# Patient Record
Sex: Female | Born: 2000
Health system: Southern US, Community
[De-identification: ages and names within clinical notes are randomized; demographics above are authoritative.]

## PROBLEM LIST (undated history)

## (undated) DIAGNOSIS — G43909 Migraine, unspecified, not intractable, without status migrainosus: Secondary | ICD-10-CM

## (undated) DIAGNOSIS — K219 Gastro-esophageal reflux disease without esophagitis: Secondary | ICD-10-CM

## (undated) DIAGNOSIS — J45909 Unspecified asthma, uncomplicated: Secondary | ICD-10-CM

## (undated) DIAGNOSIS — R011 Cardiac murmur, unspecified: Secondary | ICD-10-CM

## (undated) HISTORY — DX: Migraine, unspecified, not intractable, without status migrainosus: G43.909

## (undated) HISTORY — DX: Gastro-esophageal reflux disease without esophagitis: K21.9

## (undated) HISTORY — PX: TYMPANOSTOMY TUBE PLACEMENT: SHX32

---
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
## 2020-01-04 ENCOUNTER — Ambulatory Visit: Payer: Self-pay | Attending: Internal Medicine

## 2020-01-04 DIAGNOSIS — Z23 Encounter for immunization: Secondary | ICD-10-CM

## 2020-01-04 NOTE — Progress Notes (Signed)
   Covid-19 Vaccination Clinic  Name:  Allison Hayes    MRN: [id number] DOB: 07/17/2001  01/04/2020  [name] was observed post Covid-19 immunization for 15 minutes without incident. She was provided with Vaccine Information Sheet and instruction to access the V-Safe system.   Ms. Test was instructed to call 911 with any severe reactions post vaccine: Marland Kitchen Difficulty breathing  . Swelling of face and throat  . A fast heartbeat  . A bad rash all over body  . Dizziness and weakness   Immunizations Administered    Name Date Dose VIS Date Route   Moderna COVID-19 Vaccine 01/04/2020 11:20 AM 0.5 mL 09/26/2019 Intramuscular   Manufacturer: Moderna   Lot: [id number]   NDC: 77939-688-64

## 2020-02-06 ENCOUNTER — Ambulatory Visit: Payer: Self-pay | Attending: Internal Medicine

## 2020-02-06 DIAGNOSIS — Z23 Encounter for immunization: Secondary | ICD-10-CM

## 2020-02-06 NOTE — Progress Notes (Signed)
   Covid-19 Vaccination Clinic  Name:  Allison Hayes    MRN: [id number] DOB: [birth year]  02/06/2020  Allison Hayes was observed post Covid-19 immunization for 15 minutes without incident. She was provided with Vaccine Information Sheet and instruction to access the V-Safe system.   Allison Hayes was instructed to call 911 with any severe reactions post vaccine: Marland Kitchen Difficulty breathing  . Swelling of face and throat  . A fast heartbeat  . A bad rash all over body  . Dizziness and weakness   Immunizations Administered    Name Date Dose VIS Date Route   Moderna COVID-19 Vaccine 02/06/2020 11:30 AM 0.5 mL 09/26/2019 Intramuscular   Manufacturer: Moderna   Lot: [id number]   NDC: 30940-768-08

## 2020-03-27 ENCOUNTER — Telehealth: Payer: Self-pay | Admitting: Orthopedic Surgery

## 2020-03-27 NOTE — Telephone Encounter (Signed)
Marrie's mother, Gentry Fitz called yesterday stating that Christon's hip was bothering her and wanted to know if Shelita could come in before the end of the week to see Dr. Romeo Apple.  I told her that as it stands, I did not have any new patient appointments available.  She asked if I would call her back and let her know if we had a cancellation and I told her that I would do that.

## 2020-05-17 ENCOUNTER — Telehealth (INDEPENDENT_AMBULATORY_CARE_PROVIDER_SITE_OTHER): Payer: Self-pay | Admitting: Neurology

## 2020-05-17 DIAGNOSIS — R519 Headache, unspecified: Secondary | ICD-10-CM

## 2020-05-17 MED ORDER — AMITRIPTYLINE HCL 25 MG PO TABS: 25.0000 mg | ORAL_TABLET | Freq: Every day | ORAL | 0 refills | Status: DC

## 2020-05-17 NOTE — Telephone Encounter (Signed)
Need approval for refill

## 2020-05-17 NOTE — Telephone Encounter (Signed)
°  Who's calling (name and relationship to patient) : Gentry Fitz ( mom)  Best contact number:[phone]  Provider they see: Dr. Devonne Doughty  Reason for call: Mom called patient needs refill called in for her amitriptyline they are going out of town tomorrow for two weeks and she needs this ASAP. Patient has not been seen in office since 10-03-2019 please advise if I need to schedule an appointment for this patient.     PRESCRIPTION REFILL ONLY  Name of prescription: Amitriptyline   [facility]acy: [facility] [address] [place]

## 2020-05-17 NOTE — Telephone Encounter (Signed)
Patient is in need of an appointment to continue receiving refills. I have sent in a 30-day refill to her pharmacy as well. Please call to schedule

## 2020-06-16 ENCOUNTER — Other Ambulatory Visit (INDEPENDENT_AMBULATORY_CARE_PROVIDER_SITE_OTHER): Payer: Self-pay | Admitting: Neurology

## 2020-06-16 DIAGNOSIS — R519 Headache, unspecified: Secondary | ICD-10-CM

## 2020-06-17 ENCOUNTER — Telehealth (INDEPENDENT_AMBULATORY_CARE_PROVIDER_SITE_OTHER): Payer: Self-pay | Admitting: Neurology

## 2020-06-17 DIAGNOSIS — R519 Headache, unspecified: Secondary | ICD-10-CM

## 2020-06-17 NOTE — Telephone Encounter (Signed)
°  Who's calling (name and relationship to patient) : Gentry Fitz (mom)  Best contact number: [phone]  Provider they see: Dr. Devonne Doughty  Reason for call: Needs refill sent to pharmacy.    PRESCRIPTION REFILL ONLY  Name of prescription: amitriptyline (ELAVIL) 25 MG tablet  Pharmacy:  [id number] - [place],  - [address] AT [address]

## 2020-06-18 MED ORDER — AMITRIPTYLINE HCL 25 MG PO TABS: 25.0000 mg | ORAL_TABLET | Freq: Every day | ORAL | 0 refills | Status: DC

## 2020-06-19 ENCOUNTER — Other Ambulatory Visit (INDEPENDENT_AMBULATORY_CARE_PROVIDER_SITE_OTHER): Payer: Self-pay | Admitting: Neurology

## 2020-06-19 DIAGNOSIS — R519 Headache, unspecified: Secondary | ICD-10-CM

## 2020-07-19 ENCOUNTER — Ambulatory Visit (INDEPENDENT_AMBULATORY_CARE_PROVIDER_SITE_OTHER): Payer: BC Managed Care – PPO | Admitting: Neurology

## 2020-07-20 ENCOUNTER — Other Ambulatory Visit (INDEPENDENT_AMBULATORY_CARE_PROVIDER_SITE_OTHER): Payer: Self-pay | Admitting: Pediatrics

## 2020-07-20 DIAGNOSIS — R519 Headache, unspecified: Secondary | ICD-10-CM

## 2020-07-22 ENCOUNTER — Telehealth (INDEPENDENT_AMBULATORY_CARE_PROVIDER_SITE_OTHER): Payer: Self-pay | Admitting: Neurology

## 2020-07-22 NOTE — Telephone Encounter (Signed)
  Who's calling (name and relationship to patient) : Gentry Fitz (mom)  Best contact number: [phone]  Provider they see: Dr. Devonne Doughty  Reason for call: Needs refill sent to pharmacy - completely out of medication.    PRESCRIPTION REFILL ONLY  Name of prescription: amitriptyline (ELAVIL) 25 MG tablet  Pharmacy: [id number] - [place], [place] - [address]

## 2020-07-22 NOTE — Telephone Encounter (Signed)
Sent by [employer]

## 2020-08-02 ENCOUNTER — Encounter (INDEPENDENT_AMBULATORY_CARE_PROVIDER_SITE_OTHER): Payer: Self-pay | Admitting: Neurology

## 2020-08-02 ENCOUNTER — Ambulatory Visit (INDEPENDENT_AMBULATORY_CARE_PROVIDER_SITE_OTHER): Payer: BC Managed Care – PPO | Admitting: Neurology

## 2020-08-02 ENCOUNTER — Other Ambulatory Visit: Payer: Self-pay

## 2020-08-02 VITALS — BP 118/76 | HR 74 | Ht 62.01 in | Wt 161.4 lb

## 2020-08-02 DIAGNOSIS — R519 Headache, unspecified: Secondary | ICD-10-CM | POA: Diagnosis not present

## 2020-08-02 DIAGNOSIS — G44209 Tension-type headache, unspecified, not intractable: Secondary | ICD-10-CM | POA: Diagnosis not present

## 2020-08-02 DIAGNOSIS — F411 Generalized anxiety disorder: Secondary | ICD-10-CM

## 2020-08-02 MED ORDER — AMITRIPTYLINE HCL 25 MG PO TABS: ORAL_TABLET | ORAL | 6 refills | Status: DC

## 2020-08-02 NOTE — Patient Instructions (Addendum)
Continue the same dose of amitriptyline for now Continue with appropriate hydration and sleep and limited screen time May take occasional Tylenol or ibuprofen for moderate to severe headache If if you develop more frequent headaches, call the office and let me know  Have regular exercise on a daily basis Return in 7 months for follow-up visit

## 2020-08-02 NOTE — Progress Notes (Signed)
Patient: Allison Hayes MRN: [id number] Sex: female DOB: 2001/09/21  Provider: Keturah Shavers, MD Location of Care: [facility]  Note type: Routine return visit  Referral Source: Nelda Marseille, MD History from: patient, [facility] chart and mom Chief Complaint: Headache  History of Present Illness: Allison Hayes is a 19 y.o. female is here for follow-up management of headache. She has been having episodes of migraine and tension type headaches with some anxiety issues for the past few years for which she has been on low-dose amitriptyline with good headache control.  She was last seen in December 2020 and since then she has been having on average 1 headache each week which for some of them she may take OTC medications but not for all of them. None of the headaches would be accompanied by nausea or vomiting or any other symptoms and usually it may last for a couple of hours or so. She usually sleeps well without any difficulty and with no awakening headaches. She denies having any specific stress or anxiety issues except for stress at work but she is doing now.  She is also in college. She is not on any other medication and has not had any other medical issues since her last visit.  She and her mother are happy with her progress and do not have any other complaints or concerns at this time.  Review of Systems: Review of system as per HPI, otherwise negative.  Past Medical History:  Diagnosis Date  . Asthma   . Heart murmur    Hospitalizations: No., Head Injury: No., Nervous System Infections: No., Immunizations up to date: Yes.     Surgical History Past Surgical History:  Procedure Laterality Date  . TYMPANOSTOMY TUBE PLACEMENT      Family History family history includes Allergic rhinitis in her brother, father, mother, and sister; Anxiety disorder in her mother.  Social History Social History   Socioeconomic History  . Marital status: Single    Spouse name:  Not on file  . Number of children: Not on file  . Years of education: Not on file  . Highest education level: Not on file  Occupational History  . Not on file  Tobacco Use  . Smoking status: Never Smoker  . Smokeless tobacco: Never Used  Substance and Sexual Activity  . Alcohol use: No  . Drug use: No  . Sexual activity: Not on file  Other Topics Concern  . Not on file  Social History Narrative   Lives at home with mom, dad and brother. She is planning on attending [employer]. She enjoys Public house manager, music and watching TV   Social Determinants of Health   Financial Resource Strain:   . Difficulty of Paying Living Expenses: Not on file  Food Insecurity:   . Worried About Programme researcher, broadcasting/film/video in the Last Year: Not on file  . Ran Out of Food in the Last Year: Not on file  Transportation Needs:   . Lack of Transportation (Medical): Not on file  . Lack of Transportation (Non-Medical): Not on file  Physical Activity:   . Days of Exercise per Week: Not on file  . Minutes of Exercise per Session: Not on file  Stress:   . Feeling of Stress : Not on file  Social Connections:   . Frequency of Communication with Friends and Family: Not on file  . Frequency of Social Gatherings with Friends and Family: Not on file  . Attends Religious Services: Not  on file  . Active Member of Clubs or Organizations: Not on file  . Attends Banker Meetings: Not on file  . Marital Status: Not on file     No Known Allergies  Physical Exam BP 118/76   Pulse 74   Ht 5' 2.01" (1.575 m)   Wt 161 lb 6 oz (73.2 kg)   BMI 29.51 kg/m  Gen: Awake, alert, not in distress Skin: No rash, No neurocutaneous stigmata. HEENT: Normocephalic, no dysmorphic features, no conjunctival injection, nares patent, mucous membranes moist, oropharynx clear. Neck: Supple, no meningismus. No focal tenderness. Resp: Clear to auscultation bilaterally CV: Regular rate, normal S1/S2, no murmurs, no rubs Abd:  BS present, abdomen soft, non-tender, non-distended. No hepatosplenomegaly or mass Ext: Warm and well-perfused. No deformities, no muscle wasting, ROM full.  Neurological Examination: MS: Awake, alert, interactive. Normal eye contact, answered the questions appropriately, speech was fluent,  Normal comprehension.  Attention and concentration were normal. Cranial Nerves: Pupils were equal and reactive to light ( 5-31mm);  normal fundoscopic exam with sharp discs, visual field full with confrontation test; EOM normal, no nystagmus; no ptsosis, no double vision, intact facial sensation, face symmetric with full strength of facial muscles, hearing intact to finger rub bilaterally, palate elevation is symmetric, tongue protrusion is symmetric with full movement to both sides.  Sternocleidomastoid and trapezius are with normal strength. Tone-Normal Strength-Normal strength in all muscle groups DTRs-  Biceps Triceps Brachioradialis Patellar Ankle  R 2+ 2+ 2+ 2+ 2+  L 2+ 2+ 2+ 2+ 2+   Plantar responses flexor bilaterally, no clonus noted Sensation: Intact to light touch, temperature, vibration, Romberg negative. Coordination: No dysmetria on FTN test. No difficulty with balance. Gait: Normal walk and run. Tandem gait was normal. Was able to perform toe walking and heel walking without difficulty.   Assessment and Plan 1. Frequent headaches   2. Tension headache   3. Anxiety state     This is a 19 year old female with diagnosis of migraine and tension type headaches with fairly good control on low-dose amitriptyline, tolerating medication well with no side effects. Recommend to continue the same dose of amitriptyline at 25 mg every night. She may benefit from taking dietary supplements. She also may benefit from regular exercise on a daily basis. She will continue with more hydration and adequate sleep and limited screen time She may take occasional Tylenol or ibuprofen for moderate to severe  headache She will call my office if she develops more frequent headaches Otherwise I would like to see her in 7 months for follow-up visit to adjust the dose of medication if needed.  She and her mother understood and agreed with the plan.  Meds ordered this encounter  Medications  . amitriptyline (ELAVIL) 25 MG tablet    Sig: TAKE 1 TABLET(25 MG) BY MOUTH AT BEDTIME.    Dispense:  30 tablet    Refill:  6

## 2020-08-18 ENCOUNTER — Encounter: Payer: Self-pay | Admitting: Emergency Medicine

## 2020-08-18 ENCOUNTER — Other Ambulatory Visit: Payer: Self-pay

## 2020-08-18 ENCOUNTER — Ambulatory Visit
Admission: EM | Admit: 2020-08-18 | Discharge: 2020-08-18 | Disposition: A | Discharge status: Home / Self Care | Payer: BC Managed Care – PPO | Attending: Emergency Medicine | Admitting: Emergency Medicine

## 2020-08-18 DIAGNOSIS — J069 Acute upper respiratory infection, unspecified: Secondary | ICD-10-CM

## 2020-08-18 DIAGNOSIS — Z1152 Encounter for screening for COVID-19: Secondary | ICD-10-CM

## 2020-08-18 DIAGNOSIS — J019 Acute sinusitis, unspecified: Secondary | ICD-10-CM

## 2020-08-18 DIAGNOSIS — B9789 Other viral agents as the cause of diseases classified elsewhere: Secondary | ICD-10-CM | POA: Diagnosis not present

## 2020-08-18 MED ORDER — CETIRIZINE HCL 10 MG PO TABS: 10.0000 mg | ORAL_TABLET | Freq: Every day | ORAL | 0 refills | Status: AC

## 2020-08-18 MED ORDER — BENZONATATE 100 MG PO CAPS: 100.0000 mg | ORAL_CAPSULE | Freq: Three times a day (TID) | ORAL | 0 refills | Status: DC

## 2020-08-18 MED ORDER — FLUTICASONE PROPIONATE 50 MCG/ACT NA SUSP: 1.0000 | Freq: Every day | NASAL | 0 refills | Status: DC

## 2020-08-18 MED ORDER — DEXAMETHASONE 4 MG PO TABS: 4.0000 mg | ORAL_TABLET | Freq: Every day | ORAL | 0 refills | Status: AC

## 2020-08-18 NOTE — ED Triage Notes (Signed)
Headache, sinus pressure, congestion and sounds are muffled in both ears started since yesterday morning.

## 2020-08-18 NOTE — ED Provider Notes (Signed)
[facility]   [id number] 08/18/20 Arrival Time: 1534   Chief Complaint  Patient presents with  . Headache     SUBJECTIVE: History from: patient.  Allison Hayes is a 19 y.o. female who presented to the urgent care with a complaint of headache, sinus pressure, nasal congestion and muffled sound in both ears that started yesterday.  Denies sick exposure to COVID, flu or strep.  Denies recent travel.  Has tried OTC medication without relief.  Denies aggravating factor.  Denies previous symptoms in the past.   Denies fever, chills, fatigue,  rhinorrhea, sore throat, SOB, wheezing, chest pain, nausea, changes in bowel or bladder habits.     ROS: As per HPI.  All other pertinent ROS negative.      Past Medical History:  Diagnosis Date  . Asthma   . Heart murmur    Past Surgical History:  Procedure Laterality Date  . TYMPANOSTOMY TUBE PLACEMENT     No Known Allergies No current facility-administered medications on file prior to encounter.   Current Outpatient Medications on File Prior to Encounter  Medication Sig Dispense Refill  . albuterol (PROVENTIL HFA;VENTOLIN HFA) 108 (90 BASE) MCG/ACT inhaler Inhale 2 puffs into the lungs every 6 (six) hours as needed for wheezing or shortness of breath.    Marland Kitchen albuterol (PROVENTIL) (2.5 MG/3ML) 0.083% nebulizer solution Take 2.5 mg by nebulization every 6 (six) hours as needed for wheezing or shortness of breath.    Marland Kitchen albuterol (PROVENTIL) (2.5 MG/3ML) 0.083% nebulizer solution Take 3 mLs (2.5 mg total) by nebulization every 4 (four) hours. 75 mL 1  . amitriptyline (ELAVIL) 25 MG tablet TAKE 1 TABLET(25 MG) BY MOUTH AT BEDTIME. 30 tablet 6  . budesonide-formoterol (SYMBICORT) 80-4.5 MCG/ACT inhaler Inhale 2 puffs into the lungs 2 (two) times daily. 1 Inhaler 5  . Magnesium Oxide 500 MG TABS Take 1 tablet (500 mg total) by mouth daily. (Patient not taking: Reported on 04/27/2019)  0  . MICROGESTIN FE 1/20 1-20 MG-MCG tablet TK 1 T PO  D  2  . mometasone-formoterol (DULERA) 200-5 MCG/ACT AERO Inhale 2 puffs into the lungs 2 (two) times daily. (Patient not taking: Reported on 09/12/2018) 1 Inhaler 5  . montelukast (SINGULAIR) 10 MG tablet Take 10 mg by mouth at bedtime.    . norethindrone-ethinyl estradiol (AUROVELA 1/20) 1-20 MG-MCG tablet Take 1 tablet by mouth daily. (Patient not taking: Reported on 08/02/2020)    . riboflavin (VITAMIN B-2) 100 MG TABS tablet Take 1 tablet (100 mg total) by mouth daily. (Patient not taking: Reported on 04/27/2019)  0  . triamcinolone (NASACORT ALLERGY 24HR) 55 MCG/ACT AERO nasal inhaler Place 2 sprays into the nose daily.     Social History   Socioeconomic History  . Marital status: Single    Spouse name: Not on file  . Number of children: Not on file  . Years of education: Not on file  . Highest education level: Not on file  Occupational History  . Not on file  Tobacco Use  . Smoking status: Never Smoker  . Smokeless tobacco: Never Used  Substance and Sexual Activity  . Alcohol use: No  . Drug use: No  . Sexual activity: Not on file  Other Topics Concern  . Not on file  Social History Narrative   Lives at home with mom, dad and brother. She is planning on attending [employer]. She enjoys Public house manager, music and watching TV   Social Determinants of Health  Financial Resource Strain:   . Difficulty of Paying Living Expenses: Not on file  Food Insecurity:   . Worried About Programme researcher, broadcasting/film/video in the Last Year: Not on file  . Ran Out of Food in the Last Year: Not on file  Transportation Needs:   . Lack of Transportation (Medical): Not on file  . Lack of Transportation (Non-Medical): Not on file  Physical Activity:   . Days of Exercise per Week: Not on file  . Minutes of Exercise per Session: Not on file  Stress:   . Feeling of Stress : Not on file  Social Connections:   . Frequency of Communication with Friends and Family: Not on file  . Frequency of Social Gatherings  with Friends and Family: Not on file  . Attends Religious Services: Not on file  . Active Member of Clubs or Organizations: Not on file  . Attends Banker Meetings: Not on file  . Marital Status: Not on file  Intimate Partner Violence:   . Fear of Current or Ex-Partner: Not on file  . Emotionally Abused: Not on file  . Physically Abused: Not on file  . Sexually Abused: Not on file   Family History  Problem Relation Age of Onset  . Allergic rhinitis Mother   . Anxiety disorder Mother   . Allergic rhinitis Father   . Allergic rhinitis Sister   . Allergic rhinitis Brother   . Angioedema Neg Hx   . Asthma Neg Hx   . Atopy Neg Hx   . Eczema Neg Hx   . Urticaria Neg Hx   . Immunodeficiency Neg Hx   . Migraines Neg Hx   . Seizures Neg Hx   . Autism Neg Hx   . ADD / ADHD Neg Hx   . Depression Neg Hx   . Bipolar disorder Neg Hx   . Schizophrenia Neg Hx     OBJECTIVE:  Vitals:   08/18/20 1546 08/18/20 1547  BP:  113/78  Pulse:  (!) 123  Resp:  17  Temp:  99.2 F (37.3 C)  TempSrc:  Oral  SpO2:  96%  Weight: 155 lb (70.3 kg)   Height: 5\' 2"  (1.575 m)      General appearance: alert; appears fatigued, but nontoxic; speaking in full sentences and tolerating own secretions HEENT: NCAT; Ears: EACs clear, TMs pearly gray; Eyes: PERRL.  EOM grossly intact. Sinuses: nontender; Nose: nares patent without rhinorrhea, Throat: oropharynx clear, tonsils non erythematous or enlarged, uvula midline  Neck: supple without LAD Lungs: unlabored respirations, symmetrical air entry; cough: absent; no respiratory distress; CTAB Heart: regular rate and rhythm.  Radial pulses 2+ symmetrical bilaterally Skin: warm and dry Psychological: alert and cooperative; normal mood and affect  LABS:  No results found for this or any previous visit (from the past 24 hour(s)).   ASSESSMENT & PLAN:  1. URI with cough and congestion   2. Acute viral sinusitis   3. Encounter for screening  for COVID-19     Meds ordered this encounter  Medications  . fluticasone (FLONASE) 50 MCG/ACT nasal spray    Sig: Place 1 spray into both nostrils daily for 14 days.    Dispense:  16 g    Refill:  0  . cetirizine (ZYRTEC ALLERGY) 10 MG tablet    Sig: Take 1 tablet (10 mg total) by mouth daily.    Dispense:  30 tablet    Refill:  0  . dexamethasone (DECADRON) 4 MG  tablet    Sig: Take 1 tablet (4 mg total) by mouth daily for 7 days.    Dispense:  7 tablet    Refill:  0  . benzonatate (TESSALON) 100 MG capsule    Sig: Take 1 capsule (100 mg total) by mouth every 8 (eight) hours.    Dispense:  30 capsule    Refill:  0    Discharge Instructions  COVID testing ordered.  It will take between 2-7 days for test results.  Someone will contact you regarding abnormal results.    In the meantime: You should remain isolated in your home for 10 days from symptom onset AND greater than 72 hours after symptoms resolution (absence of fever without the use of fever-reducing medication and improvement in respiratory symptoms), whichever is longer Get plenty of rest and push fluids Tessalon Perles prescribed for cough Zyrtec for nasal congestion, runny nose, and/or sore throat Flonase for nasal congestion and runny nose Decadron prescribed Use medications daily for symptom relief Use OTC medications like ibuprofen or tylenol as needed fever or pain Call or go to the ED if you have any new or worsening symptoms such as fever, worsening cough, shortness of breath, chest tightness, chest pain, turning blue, changes in mental status, etc...   Reviewed expectations re: course of current medical issues. Questions answered. Outlined signs and symptoms indicating need for more acute intervention. Patient verbalized understanding. After Visit Summary given.         Durward Parcel, FNP 08/18/20 1557

## 2020-08-18 NOTE — Discharge Instructions (Addendum)
COVID testing ordered.  It will take between 2-7 days for test results.  Someone will contact you regarding abnormal results.    In the meantime: You should remain isolated in your home for 10 days from symptom onset AND greater than 72 hours after symptoms resolution (absence of fever without the use of fever-reducing medication and improvement in respiratory symptoms), whichever is longer Get plenty of rest and push fluids Tessalon Perles prescribed for cough Zyrtec for nasal congestion, runny nose, and/or sore throat Flonase for nasal congestion and runny nose Decadron prescribed Use medications daily for symptom relief Use OTC medications like ibuprofen or tylenol as needed fever or pain Call or go to the ED if you have any new or worsening symptoms such as fever, worsening cough, shortness of breath, chest tightness, chest pain, turning blue, changes in mental status, etc...  

## 2020-08-19 ENCOUNTER — Ambulatory Visit: Payer: Self-pay

## 2020-08-21 ENCOUNTER — Ambulatory Visit
Admission: RE | Admit: 2020-08-21 | Discharge: 2020-08-21 | Disposition: A | Discharge status: Home / Self Care | Payer: BC Managed Care – PPO | Source: Ambulatory Visit | Attending: Emergency Medicine | Admitting: Emergency Medicine

## 2020-08-21 ENCOUNTER — Ambulatory Visit (INDEPENDENT_AMBULATORY_CARE_PROVIDER_SITE_OTHER): Payer: BC Managed Care – PPO

## 2020-08-21 VITALS — BP 108/64 | HR 121 | Temp 98.2°F | Resp 20

## 2020-08-21 DIAGNOSIS — R000 Tachycardia, unspecified: Secondary | ICD-10-CM

## 2020-08-21 DIAGNOSIS — R059 Cough, unspecified: Secondary | ICD-10-CM

## 2020-08-21 DIAGNOSIS — R0602 Shortness of breath: Secondary | ICD-10-CM

## 2020-08-21 MED ORDER — ALBUTEROL SULFATE HFA 108 (90 BASE) MCG/ACT IN AERS: 1.0000 | INHALATION_SPRAY | Freq: Four times a day (QID) | RESPIRATORY_TRACT | 0 refills | Status: DC | PRN

## 2020-08-21 NOTE — ED Triage Notes (Signed)
Pt presents with worsening cough and some sob, covid test not resulted

## 2020-08-21 NOTE — Discharge Instructions (Addendum)
Get plenty of rest and push fluids Continue to take Zyrtec, Flonase, Decadron, and Tessalon as prescribed ProAir was prescribed for shortness of breath  Use OTC medications like ibuprofen or tylenol as needed fever or pain Call or go to the ED if you have any new or worsening symptoms such as fever, worsening cough, shortness of breath, chest tightness, chest pain, turning blue, changes in mental status, etc..Marland Kitchen

## 2020-08-21 NOTE — ED Provider Notes (Signed)
[facility]   [id number] 08/21/20 Arrival Time: 1359   CC: COVID symptoms  SUBJECTIVE: History from: patient.  Allison Hayes is a 19 y.o. female who presented to the urgent care for complaint of worsening cough and shortness of breath for the past 3 days.  Was seen at the urgent care on 08/18/2020.  Had Covid test 19 completed and result is currently pending.  Tessalon Perles, Decadron, Flonase and Zyrtec were prescribed.  Denies sick exposure to COVID, flu or strep.  Denies recent travel.   Denies fever, chills, fatigue, sinus pain, rhinorrhea, sore throat, wheezing, chest pain, nausea, changes in bowel or bladder habits.    ROS: As per HPI.  All other pertinent ROS negative.     Past Medical History:  Diagnosis Date  . Asthma   . Heart murmur    Past Surgical History:  Procedure Laterality Date  . TYMPANOSTOMY TUBE PLACEMENT     No Known Allergies No current facility-administered medications on file prior to encounter.   Current Outpatient Medications on File Prior to Encounter  Medication Sig Dispense Refill  . amitriptyline (ELAVIL) 25 MG tablet TAKE 1 TABLET(25 MG) BY MOUTH AT BEDTIME. 30 tablet 6  . benzonatate (TESSALON) 100 MG capsule Take 1 capsule (100 mg total) by mouth every 8 (eight) hours. 30 capsule 0  . budesonide-formoterol (SYMBICORT) 80-4.5 MCG/ACT inhaler Inhale 2 puffs into the lungs 2 (two) times daily. 1 Inhaler 5  . cetirizine (ZYRTEC ALLERGY) 10 MG tablet Take 1 tablet (10 mg total) by mouth daily. 30 tablet 0  . dexamethasone (DECADRON) 4 MG tablet Take 1 tablet (4 mg total) by mouth daily for 7 days. 7 tablet 0  . fluticasone (FLONASE) 50 MCG/ACT nasal spray Place 1 spray into both nostrils daily for 14 days. 16 g 0  . Magnesium Oxide 500 MG TABS Take 1 tablet (500 mg total) by mouth daily. (Patient not taking: Reported on 04/27/2019)  0  . MICROGESTIN FE 1/20 1-20 MG-MCG tablet TK 1 T PO D  2  . mometasone-formoterol (DULERA) 200-5 MCG/ACT  AERO Inhale 2 puffs into the lungs 2 (two) times daily. (Patient not taking: Reported on 09/12/2018) 1 Inhaler 5  . montelukast (SINGULAIR) 10 MG tablet Take 10 mg by mouth at bedtime.    . norethindrone-ethinyl estradiol (AUROVELA 1/20) 1-20 MG-MCG tablet Take 1 tablet by mouth daily. (Patient not taking: Reported on 08/02/2020)    . riboflavin (VITAMIN B-2) 100 MG TABS tablet Take 1 tablet (100 mg total) by mouth daily. (Patient not taking: Reported on 04/27/2019)  0  . triamcinolone (NASACORT ALLERGY 24HR) 55 MCG/ACT AERO nasal inhaler Place 2 sprays into the nose daily.     Social History   Socioeconomic History  . Marital status: Single    Spouse name: Not on file  . Number of children: Not on file  . Years of education: Not on file  . Highest education level: Not on file  Occupational History  . Not on file  Tobacco Use  . Smoking status: Never Smoker  . Smokeless tobacco: Never Used  Substance and Sexual Activity  . Alcohol use: No  . Drug use: No  . Sexual activity: Not on file  Other Topics Concern  . Not on file  Social History Narrative   Lives at home with mom, dad and brother. She is planning on attending [employer]. She enjoys Public house manager, music and watching TV   Social Determinants of Corporate investment banker  Strain:   . Difficulty of Paying Living Expenses: Not on file  Food Insecurity:   . Worried About Programme researcher, broadcasting/film/video in the Last Year: Not on file  . Ran Out of Food in the Last Year: Not on file  Transportation Needs:   . Lack of Transportation (Medical): Not on file  . Lack of Transportation (Non-Medical): Not on file  Physical Activity:   . Days of Exercise per Week: Not on file  . Minutes of Exercise per Session: Not on file  Stress:   . Feeling of Stress : Not on file  Social Connections:   . Frequency of Communication with Friends and Family: Not on file  . Frequency of Social Gatherings with Friends and Family: Not on file  . Attends  Religious Services: Not on file  . Active Member of Clubs or Organizations: Not on file  . Attends Banker Meetings: Not on file  . Marital Status: Not on file  Intimate Partner Violence:   . Fear of Current or Ex-Partner: Not on file  . Emotionally Abused: Not on file  . Physically Abused: Not on file  . Sexually Abused: Not on file   Family History  Problem Relation Age of Onset  . Allergic rhinitis Mother   . Anxiety disorder Mother   . Allergic rhinitis Father   . Allergic rhinitis Sister   . Allergic rhinitis Brother   . Angioedema Neg Hx   . Asthma Neg Hx   . Atopy Neg Hx   . Eczema Neg Hx   . Urticaria Neg Hx   . Immunodeficiency Neg Hx   . Migraines Neg Hx   . Seizures Neg Hx   . Autism Neg Hx   . ADD / ADHD Neg Hx   . Depression Neg Hx   . Bipolar disorder Neg Hx   . Schizophrenia Neg Hx     OBJECTIVE:  Vitals:   08/21/20 1408 08/21/20 1410  BP:  108/64  Pulse: (!) 121   Resp: 20   Temp: 98.2 F (36.8 C)   SpO2: 97%      General appearance: alert; appears fatigued, but nontoxic; speaking in full sentences and tolerating own secretions HEENT: NCAT; Ears: EACs clear, TMs pearly gray; Eyes: PERRL.  EOM grossly intact. Sinuses: nontender; Nose: nares patent without rhinorrhea, Throat: oropharynx clear, tonsils non erythematous or enlarged, uvula midline  Neck: supple without LAD Lungs: unlabored respirations, symmetrical air entry; cough: moderate; no respiratory distress; CTAB Heart: regular rate and rhythm.  Radial pulses 2+ symmetrical bilaterally Skin: warm and dry Psychological: alert and cooperative; normal mood and affect  LABS:  No results found for this or any previous visit (from the past 24 hour(s)).   RADIOLOGY:  DG Chest 2 View  Result Date: 08/21/2020 CLINICAL DATA:  Productive cough 5-6 days. Patient reports tachycardia. EXAM: CHEST - 2 VIEW COMPARISON:  PA and lateral chest 08/04/2016. FINDINGS: Lungs clear. Heart size  normal. No pneumothorax or pleural fluid. No acute or focal bony abnormality. IMPRESSION: Negative chest. Electronically Signed   By: Drusilla Kanner M.D.   On: 08/21/2020 14:31      ASSESSMENT & PLAN:  1. SOB (shortness of breath)   2. Cough in adult     Meds ordered this encounter  Medications  . albuterol (VENTOLIN HFA) 108 (90 Base) MCG/ACT inhaler    Sig: Inhale 1-2 puffs into the lungs every 6 (six) hours as needed for wheezing or shortness of breath.  Dispense:  18 g    Refill:  0    Discharge instructions  Get plenty of rest and push fluids Continue to take Zyrtec, Flonase, Decadron, and Tessalon as prescribed ProAir was prescribed for shortness of breath  Use OTC medications like ibuprofen or tylenol as needed fever or pain Call or go to the ED if you have any new or worsening symptoms such as fever, worsening cough, shortness of breath, chest tightness, chest pain, turning blue, changes in mental status, etc...   Reviewed expectations re: course of current medical issues. Questions answered. Outlined signs and symptoms indicating need for more acute intervention. Patient verbalized understanding. After Visit Summary given.         Durward Parcel, FNP 08/21/20 1440

## 2020-11-08 ENCOUNTER — Telehealth (INDEPENDENT_AMBULATORY_CARE_PROVIDER_SITE_OTHER): Payer: Self-pay | Admitting: Neurology

## 2020-11-08 NOTE — Telephone Encounter (Signed)
Please advise 

## 2020-11-08 NOTE — Telephone Encounter (Signed)
I called and there was no answer.   If she is having frequent headaches, please make an appointment for her  next Thursday and I will see her and discuss if any tests or treatment needed.

## 2020-11-08 NOTE — Telephone Encounter (Signed)
  Who's calling (name and relationship to patient) : Gentry Fitz (mom)  Best contact number: [phone] (patient's number)  Provider they see: Dr. Devonne Doughty  Reason for call: Mother calls to report that patient has been having frequent headaches and just recently saw PCP who recommended that they contact us and see if and MRI/MRA Brain could be scheduled. We do not have DPR on file with permission to speak to mom. Patient's number is listed above.    PRESCRIPTION REFILL ONLY  Name of prescription:  Pharmacy:

## 2020-11-19 NOTE — Telephone Encounter (Signed)
Lvm for mom or patient to return my call

## 2020-11-20 ENCOUNTER — Encounter (INDEPENDENT_AMBULATORY_CARE_PROVIDER_SITE_OTHER): Payer: Self-pay | Admitting: Neurology

## 2020-11-20 NOTE — Telephone Encounter (Signed)
Returned call and made appt for 2/18. Could not make sooner appt as patient is in college.

## 2020-11-20 NOTE — Telephone Encounter (Signed)
Have attempted to call three times without success. Will send [employer] letter

## 2020-12-13 ENCOUNTER — Ambulatory Visit (INDEPENDENT_AMBULATORY_CARE_PROVIDER_SITE_OTHER): Payer: BC Managed Care – PPO | Admitting: Neurology

## 2021-01-17 ENCOUNTER — Ambulatory Visit (INDEPENDENT_AMBULATORY_CARE_PROVIDER_SITE_OTHER): Payer: BC Managed Care – PPO | Admitting: Neurology

## 2021-01-17 ENCOUNTER — Other Ambulatory Visit: Payer: Self-pay

## 2021-01-17 ENCOUNTER — Encounter (INDEPENDENT_AMBULATORY_CARE_PROVIDER_SITE_OTHER): Payer: Self-pay | Admitting: Neurology

## 2021-01-17 VITALS — BP 116/78 | HR 82 | Ht 62.01 in | Wt 170.2 lb

## 2021-01-17 DIAGNOSIS — F411 Generalized anxiety disorder: Secondary | ICD-10-CM | POA: Diagnosis not present

## 2021-01-17 DIAGNOSIS — G44209 Tension-type headache, unspecified, not intractable: Secondary | ICD-10-CM | POA: Diagnosis not present

## 2021-01-17 DIAGNOSIS — R519 Headache, unspecified: Secondary | ICD-10-CM | POA: Diagnosis not present

## 2021-01-17 MED ORDER — AMITRIPTYLINE HCL 25 MG PO TABS: ORAL_TABLET | ORAL | 1 refills | Status: DC

## 2021-01-17 NOTE — Patient Instructions (Signed)
Continue with the same dose of amitriptyline at 25 mg every night May start taking dietary supplements such as magnesium 500 mg and coq.10, 100 or 150 mg daily If the headaches are getting more frequent, call the office to increase the dose of amitriptyline If there are frequent vomiting or awakening headaches, he may schedule for a brain MRI Get a referral from your new PCP to see adult neurology Return in 6 months for follow-up visit unless you follow-up with adult neurology by then.

## 2021-01-17 NOTE — Progress Notes (Signed)
Patient: Allison Hayes MRN: [id number] Sex: female DOB: [birth year]  Provider: Keturah Shavers, MD Location of Care: [facility]  Note type: Routine return visit  Referral Source: Nelda Marseille, MD History from: patient, [facility] chart and mom Chief Complaint: headaches worse, discuss MRI  History of Present Illness: Allison Hayes is a 20 y.o. female is here for follow-up management of headache.  She has been having episodes of migraine and tension type headaches for the past few years and has been on amitriptyline 25 mg every night with fairly good headache control although over the past couple of weeks she has been having more frequent headaches without any specific reason other triggers. She was last seen in October 2021 and at that time she was doing very well with headaches on average once a week for which for some of them she needed to take OTC medications.  At that time she recommended to continue with the same dose of amitriptyline and return in 6 months for follow-up visit. As mentioned she has been doing very well with the same headache intensity and frequency over the past several months but just over the past few weeks she has been having slightly more frequent headaches but no vomiting and no awakening headaches. She usually sleeps well without any difficulty and with no awakening.  She denies having any stress anxiety issues.  She has no fall or head injury.  She denies having any visual changes such as blurry vision or double vision and no vomiting as mentioned.    Review of Systems: Review of system as per HPI, otherwise negative.  Past Medical History:  Diagnosis Date  . Asthma   . Heart murmur    Hospitalizations: No., Head Injury: No., Nervous System Infections: No., Immunizations up to date: Yes.    Surgical History Past Surgical History:  Procedure Laterality Date  . TYMPANOSTOMY TUBE PLACEMENT      Family History family history includes Allergic  rhinitis in her brother, father, mother, and sister; Anxiety disorder in her mother.  Social History Social History   Socioeconomic History  . Marital status: Single    Spouse name: Not on file  . Number of children: Not on file  . Years of education: Not on file  . Highest education level: Not on file  Occupational History  . Not on file  Tobacco Use  . Smoking status: Never Smoker  . Smokeless tobacco: Never Used  Substance and Sexual Activity  . Alcohol use: No  . Drug use: No  . Sexual activity: Not on file  Other Topics Concern  . Not on file  Social History Narrative   Lives at home with mom, dad and brother. She is planning on attending [employer]. She enjoys Public house manager, music and watching TV   Social Determinants of Health   Financial Resource Strain: Not on file  Food Insecurity: Not on file  Transportation Needs: Not on file  Physical Activity: Not on file  Stress: Not on file  Social Connections: Not on file     No Known Allergies  Physical Exam BP 116/78   Pulse 82   Ht 5' 2.01" (1.575 m)   Wt 170 lb 3.1 oz (77.2 kg)   BMI 31.12 kg/m  Gen: Awake, alert, not in distress Skin: No rash, No neurocutaneous stigmata. HEENT: Normocephalic, no dysmorphic features, no conjunctival injection, nares patent, mucous membranes moist, oropharynx clear. Neck: Supple, no meningismus. No focal tenderness. Resp: Clear to auscultation bilaterally CV:  Regular rate, normal S1/S2, no murmurs, no rubs Abd: BS present, abdomen soft, non-tender, non-distended. No hepatosplenomegaly or mass Ext: Warm and well-perfused. No deformities, no muscle wasting, ROM full.  Neurological Examination: MS: Awake, alert, interactive. Normal eye contact, answered the questions appropriately, speech was fluent,  Normal comprehension.  Attention and concentration were normal. Cranial Nerves: Pupils were equal and reactive to light ( 5-16mm);  normal fundoscopic exam with sharp discs,  visual field full with confrontation test; EOM normal, no nystagmus; no ptsosis, no double vision, intact facial sensation, face symmetric with full strength of facial muscles, hearing intact to finger rub bilaterally, palate elevation is symmetric, tongue protrusion is symmetric with full movement to both sides.  Sternocleidomastoid and trapezius are with normal strength. Tone-Normal Strength-Normal strength in all muscle groups DTRs-  Biceps Triceps Brachioradialis Patellar Ankle  R 2+ 2+ 2+ 2+ 2+  L 2+ 2+ 2+ 2+ 2+   Plantar responses flexor bilaterally, no clonus noted Sensation: Intact to light touch,  Romberg negative. Coordination: No dysmetria on FTN test. No difficulty with balance. Gait: Normal walk and run. Tandem gait was normal. Was able to perform toe walking and heel walking without difficulty.   Assessment and Plan 1. Frequent headaches   2. Tension headache   3. Anxiety state     This is a 20 year old female with episodes of migraine and tension type headaches for the past 3 years with fairly good headache control on low-dose amitriptyline, on average 1 headache a week although with a few more headaches over the past couple of weeks but no vomiting and no other evidence of increased ICP or intracranial pathology. I do not think performing a brain MRI is needed at this time due to having normal exam and no further evidence of a secondary headache.. Recommend to continue the same dose of amitriptyline of 25 mg every night If she develops more frequent headaches, mother will call to increase the dose of amitriptyline If she develops frequent vomiting or awakening headaches then we will schedule for a brain MRI for further evaluation She may benefit from taking dietary supplements such as magnesium and vitamin B2 or co-Q10 She may take occasional Tylenol or ibuprofen for moderate to severe headache She will continue with more hydration, adequate sleep and limiting screen  time Since she is close to 20-year of age, I would recommend to get a referral from her new PCP to see adult neurology for further management of headaches. I would like to see her in 6 months for follow-up visit unless she follows with adult neurology.  Meds ordered this encounter  Medications  . amitriptyline (ELAVIL) 25 MG tablet    Sig: TAKE 1 TABLET(25 MG) BY MOUTH AT BEDTIME.    Dispense:  90 tablet    Refill:  1   No orders of the defined types were placed in this encounter.

## 2021-02-27 ENCOUNTER — Encounter (INDEPENDENT_AMBULATORY_CARE_PROVIDER_SITE_OTHER): Payer: Self-pay

## 2021-03-04 ENCOUNTER — Ambulatory Visit (INDEPENDENT_AMBULATORY_CARE_PROVIDER_SITE_OTHER): Payer: BC Managed Care – PPO | Admitting: Neurology

## 2021-08-11 DIAGNOSIS — E782 Mixed hyperlipidemia: Secondary | ICD-10-CM | POA: Insufficient documentation

## 2021-08-11 DIAGNOSIS — E611 Iron deficiency: Secondary | ICD-10-CM | POA: Insufficient documentation

## 2021-12-08 ENCOUNTER — Encounter: Payer: Self-pay | Admitting: *Deleted

## 2021-12-09 ENCOUNTER — Ambulatory Visit (INDEPENDENT_AMBULATORY_CARE_PROVIDER_SITE_OTHER): Payer: BC Managed Care – PPO | Admitting: Psychiatry

## 2021-12-09 ENCOUNTER — Other Ambulatory Visit: Payer: Self-pay

## 2021-12-09 ENCOUNTER — Encounter: Payer: Self-pay | Admitting: Psychiatry

## 2021-12-09 VITALS — BP 108/68 | HR 80 | Ht 62.0 in | Wt 157.0 lb

## 2021-12-09 DIAGNOSIS — G43009 Migraine without aura, not intractable, without status migrainosus: Secondary | ICD-10-CM | POA: Diagnosis not present

## 2021-12-09 MED ORDER — RIZATRIPTAN BENZOATE 10 MG PO TABS: 10.0000 mg | ORAL_TABLET | ORAL | 0 refills | Status: DC | PRN

## 2021-12-09 MED ORDER — NORTRIPTYLINE HCL 25 MG PO CAPS: 25.0000 mg | ORAL_CAPSULE | Freq: Every day | ORAL | 3 refills | Status: DC

## 2021-12-09 NOTE — Patient Instructions (Signed)
Start Maxalt 10 mg as needed. Take at the onset of migraine. If headache recurs or does not fully resolve, you may take a second dose after 2 hours. Please avoid taking more than 2 days per week  Increase nortriptyline to 25 mg at bedtime

## 2021-12-09 NOTE — Progress Notes (Signed)
Referring:  Sylvester Harder, FNP [address] [place],  [place] [address]  PCP: Pcp, No  Neurology was asked to evaluate Allison Hayes, a 21 year old female for a chief complaint of headaches.  Our recommendations of care will be communicated by shared medical record.    CC:  headaches  HPI:  Medical co-morbidities: asthma, iron deficiency anemia  The patient presents for evaluation of migraines which have been present for the past 5 years. They are described as bilateral retro-orbital sharp pain with associated photophobia and nausea. They last several hours at a time. She was having daily migraines until she started taking nortriptyline 10 mg QHS. Now she has 3-4 migraines per week.  Takes ibuprofen as needed which only helps some of the time.  She also reports intermittent head twitching which occurs 1-3 times per day. No loss of consciousness, loss of continence, or tongue biting. Discussed this with her previous neurologist who suspected it was secondary to anxiety and stress. She does think it is worse when she is feeling stressed and anxious.  Headache History: Onset: 5 years ago Triggers: none Aura: no Location: bilateral retro-orbital Quality/Description: sharp Associated Symptoms:  Photophobia: yes  Phonophobia: no  Nausea: yes Worse with activity?: yes Duration of headaches: several hours   Pregnancy planning/birth control: OCPs  Headache days per month: 16 Headache free days per month: 14  Current Treatment: Abortive ibuprofen  Preventative Nortriptyline 10 mg QHS  Prior Therapies                                 Amitriptyline 25 mg QHS - lack of efficacy Nortriptyline 10 mg QHS  LABS: 01/2021 CBC wnl, LDL 123  IMAGING:  none   Current Outpatient Medications on File Prior to Visit  Medication Sig Dispense Refill   budesonide-formoterol (SYMBICORT) 80-4.5 MCG/ACT inhaler Inhale 2 puffs into the lungs 2 (two) times daily. 1 Inhaler 5    cetirizine (ZYRTEC ALLERGY) 10 MG tablet Take 1 tablet (10 mg total) by mouth daily. 30 tablet 0   Cholecalciferol (VITAMIN D-3) 25 MCG (1000 UT) CAPS Take by mouth at bedtime.     FEROSUL 325 (65 Fe) MG tablet Take 325 mg by mouth daily.     MICROGESTIN FE 1/20 1-20 MG-MCG tablet TK 1 T PO D  2   montelukast (SINGULAIR) 10 MG tablet Take 10 mg by mouth at bedtime.     norethindrone-ethinyl estradiol-iron (LOESTRIN FE) 1.5-30 MG-MCG tablet Take 1 tablet by mouth daily.     triamcinolone (NASACORT) 55 MCG/ACT AERO nasal inhaler Place 2 sprays into the nose daily.     No current facility-administered medications on file prior to visit.     Allergies: No Known Allergies  Family History: Migraine or other headaches in the family:  mother Aneurysms in a first degree relative:  none Brain tumors in the family:  none Other neurological illness in the family:   none  Past Medical History: Past Medical History:  Diagnosis Date   Asthma    Heart murmur     Past Surgical History Past Surgical History:  Procedure Laterality Date   TYMPANOSTOMY TUBE PLACEMENT      Social History: Social History   Tobacco Use   Smoking status: Never   Smokeless tobacco: Never  Substance Use Topics   Alcohol use: No   Drug use: No    ROS: Negative for fevers, chills.  Positive for headaches. All other systems reviewed and negative unless stated otherwise in HPI.   Physical Exam:   Vital Signs: BP 108/68    Pulse 80    Ht 5\' 2"  (1.575 m)    Wt 157 lb (71.2 kg)    BMI 28.72 kg/m  GENERAL: well appearing,in no acute distress,alert SKIN:  Color, texture, turgor normal. No rashes or lesions HEAD:  Normocephalic/atraumatic. CV:  RRR RESP: Normal respiratory effort MSK: no tenderness to palpation over occiput, neck, or shoulders  NEUROLOGICAL: Mental Status: Alert, oriented to person, place and time,Follows commands Cranial Nerves: PERRL,visual fields intact to confrontation,extraocular  movements intact,facial sensation intact,no facial droop or ptosis,hearing grossly intact, no dysarthria Motor: muscle strength 5/5 both upper and lower extremities,no drift, normal tone Reflexes: 2+ throughout Sensation: intact to light touch all 4 extremities Coordination: Finger-to- nose-finger intact bilaterally,Heel-to-shin intact bilaterally Gait: normal-based   IMPRESSION: 21 year old female with a history of asthma, iron deficiency anemia who presents for evaluation of migraines. Neurological exam is normal. There is no evidence of twitching on today's exam. She has had some improvement on nortriptyline but continues to have 3-4 headaches for week. Will increase dose to 25 mg and see if this provides more relief. Will start Maxalt for rescue. Counseled on limiting rescue medication use to 2 days per week.   PLAN: -Preventive: Increase nortriptyline to 25 mg QHS -Rescue: Start Maxalt 10 mg PRN -next steps: consider topamax, consider EEG if twitching worsens   I spent a total of 24 minutes chart reviewing and counseling the patient. Headache education was done. Discussed treatment options including preventive and acute medications. Discussed medication overuse headache and to limit use of acute treatments to no more than 2 days/week or 10 days/month. Discussed medication side effects, adverse reactions and drug interactions. Written educational materials and patient instructions outlining all of the above were given.  Follow-up: 3 months   26, MD 12/09/2021   3:17 PM

## 2022-02-01 IMAGING — DX DG CHEST 2V
2 series · 2 of 2 positions shown · non-contrast
Comparison: PA and lateral chest 08/04/2016.

CLINICAL DATA: Productive cough 5-6 days. Patient reports
tachycardia.

EXAM:
CHEST - 2 VIEW

[chest pa]
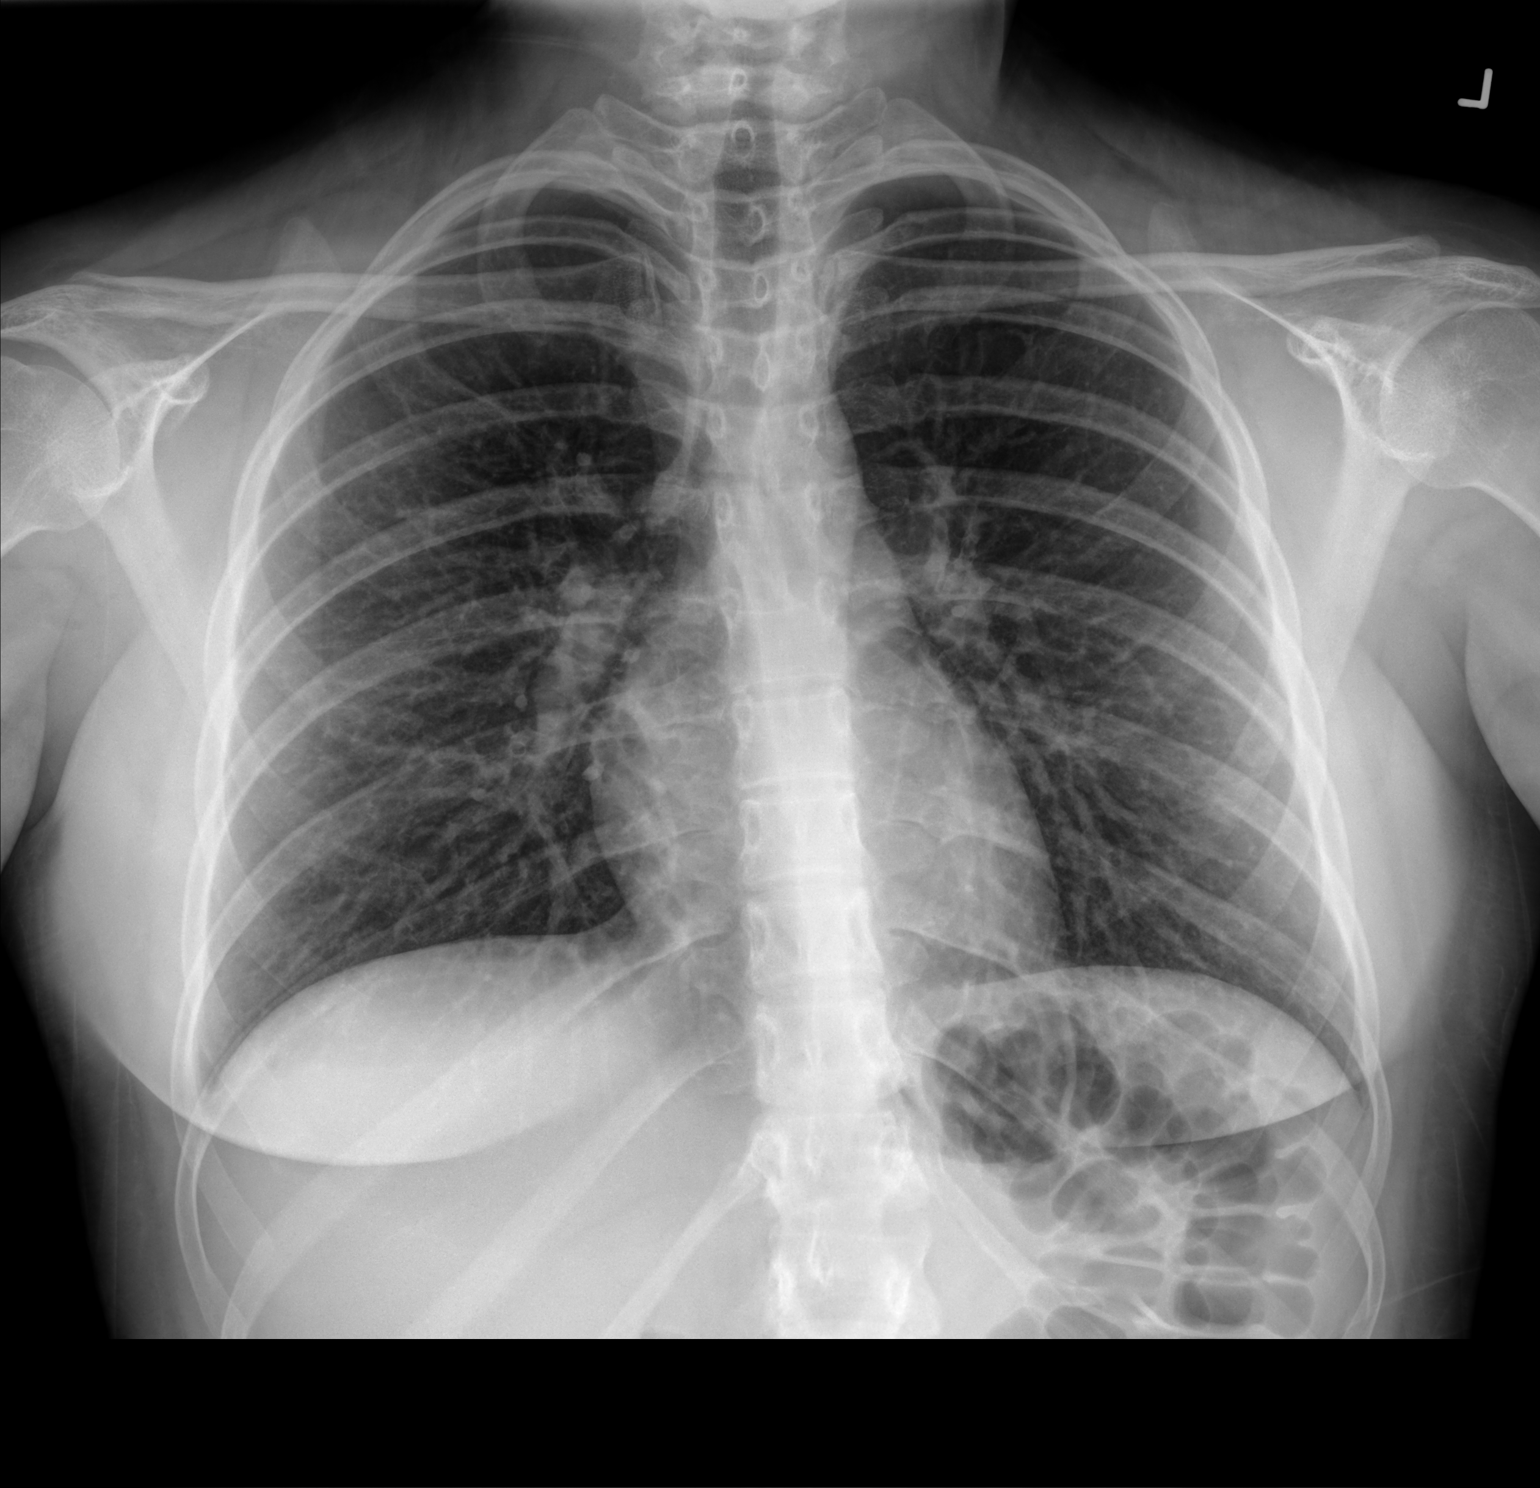

[chest lat]
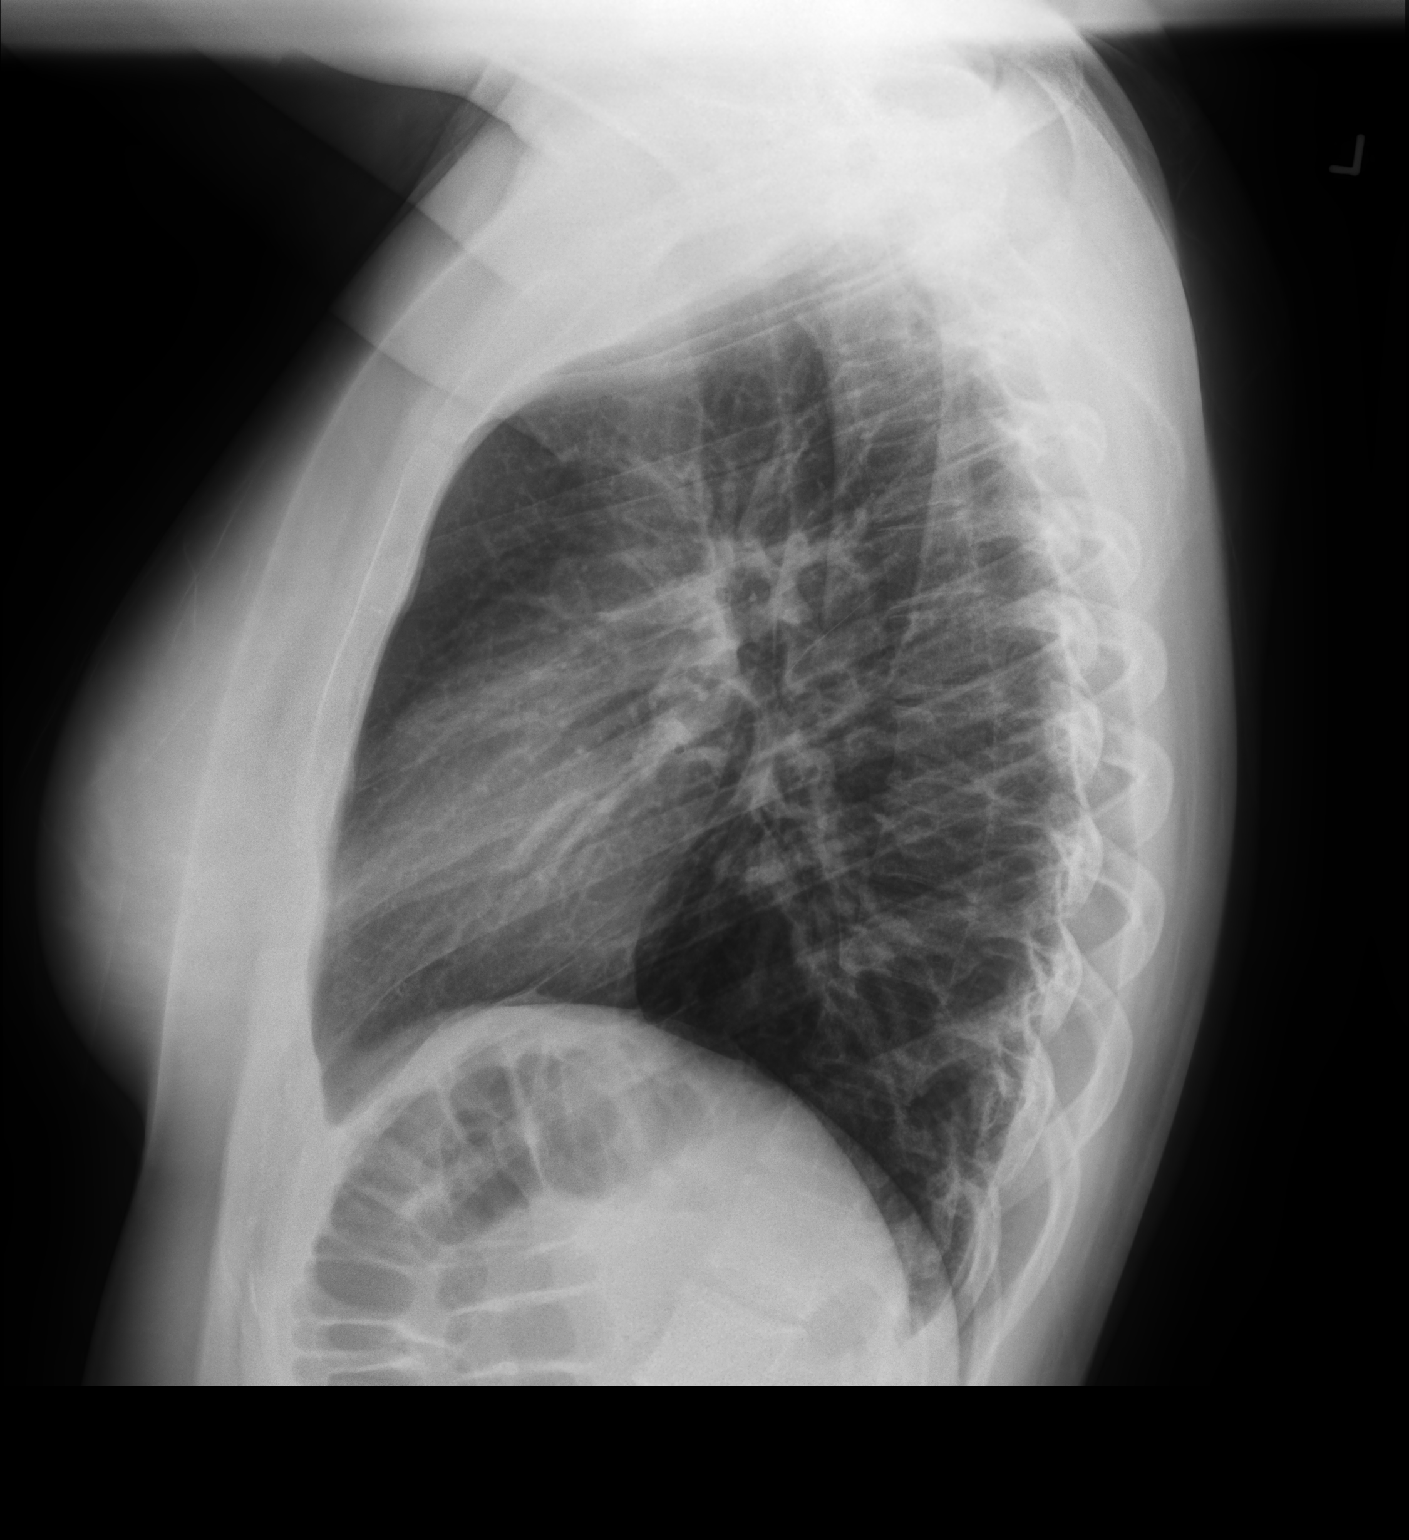

[2 of 2 positions shown; findings below may reference images not displayed]

FINDINGS: Lungs clear. Heart size normal. No pneumothorax or pleural fluid. No
acute or focal bony abnormality.
IMPRESSION: Negative chest.

## 2022-02-03 DIAGNOSIS — E663 Overweight: Secondary | ICD-10-CM | POA: Insufficient documentation

## 2022-03-17 ENCOUNTER — Telehealth: Payer: Self-pay | Admitting: Psychiatry

## 2022-03-17 ENCOUNTER — Other Ambulatory Visit: Payer: Self-pay | Admitting: Psychiatry

## 2022-03-17 DIAGNOSIS — R253 Fasciculation: Secondary | ICD-10-CM

## 2022-03-17 DIAGNOSIS — R519 Headache, unspecified: Secondary | ICD-10-CM

## 2022-03-17 NOTE — Telephone Encounter (Signed)
Pt's mother, Lance Morin. Have cancelled appt for Thursday 03/19/22 and rescheduled for 08/12/22. Want to know if physician can go ahead and order the MRI and EEG, due to severe headaches a week. Would like a call from the nurse.

## 2022-03-17 NOTE — Telephone Encounter (Signed)
Sent message to pt updating on this.

## 2022-03-17 NOTE — Telephone Encounter (Signed)
I placed orders for MRI and EEG, thanks

## 2022-03-19 ENCOUNTER — Ambulatory Visit: Payer: BC Managed Care – PPO | Admitting: Psychiatry

## 2022-03-23 ENCOUNTER — Telehealth: Payer: Self-pay | Admitting: Psychiatry

## 2022-03-23 NOTE — Telephone Encounter (Signed)
[employer] via website sent to GI they will call the patient to schedule.

## 2022-04-01 ENCOUNTER — Ambulatory Visit (INDEPENDENT_AMBULATORY_CARE_PROVIDER_SITE_OTHER): Payer: BC Managed Care – PPO | Admitting: Neurology

## 2022-04-01 DIAGNOSIS — R253 Fasciculation: Secondary | ICD-10-CM | POA: Diagnosis not present

## 2022-04-01 NOTE — Procedures (Signed)
    History:  21 year old woman with twitching episodes   EEG classification:  Awake and asleep  Description of the recording: The background rhythms of this recording consists of a fairly well modulated medium amplitude background activity of 10-11 Hz. As the record progresses, the patient initially is in the waking state, but appears to enter the early stage II sleep during the recording, with rudimentary sleep spindles and vertex sharp wave activity seen. During the wakeful state, photic stimulation is performed, and no abnormal responses were seen. Hyperventilation was also performed, no abnormal response seen. No epileptiform discharges seen during this recording. There was no focal slowing. EKG monitor shows no evidence of cardiac rhythm abnormalities with a heart rate of 84.  Abnormality: None   Impression: This is a normal EEG recording in the waking and sleeping state. No evidence interictal epileptiform discharges were seen at any time during the recording.  A normal EEG does not exclude a diagnosis of epilepsy.    Windell Norfolk, MD [facility]

## 2022-04-05 ENCOUNTER — Ambulatory Visit
Admission: RE | Admit: 2022-04-05 | Discharge: 2022-04-05 | Disposition: A | Discharge status: Home / Self Care | Payer: BC Managed Care – PPO | Source: Ambulatory Visit | Attending: Psychiatry | Admitting: Psychiatry

## 2022-04-05 DIAGNOSIS — R519 Headache, unspecified: Secondary | ICD-10-CM | POA: Diagnosis not present

## 2022-04-05 MED ORDER — GADOBENATE DIMEGLUMINE 529 MG/ML IV SOLN
14.0000 mL | Freq: Once | INTRAVENOUS | Status: AC | PRN
  Administered 2022-04-05: 14 mL via INTRAVENOUS

## 2022-04-08 ENCOUNTER — Telehealth: Payer: Self-pay | Admitting: Psychiatry

## 2022-04-08 NOTE — Telephone Encounter (Signed)
PHONE RM CAN RELAY  Contacted pt back, LVM per DPR, informing her Dr Delena Bali did send her results to her via Mychart. Her MRI was normal, no concerns was found. Number provided to call back with questions.

## 2022-04-08 NOTE — Telephone Encounter (Signed)
Pt is asking that either she or her mother be called to discuss results to MRI

## 2022-04-17 ENCOUNTER — Other Ambulatory Visit: Payer: Self-pay | Admitting: Psychiatry

## 2022-07-07 DIAGNOSIS — J069 Acute upper respiratory infection, unspecified: Secondary | ICD-10-CM | POA: Insufficient documentation

## 2022-07-07 DIAGNOSIS — J029 Acute pharyngitis, unspecified: Secondary | ICD-10-CM | POA: Insufficient documentation

## 2022-07-15 ENCOUNTER — Other Ambulatory Visit (HOSPITAL_COMMUNITY): Payer: Self-pay | Admitting: Family Medicine

## 2022-07-15 ENCOUNTER — Ambulatory Visit (HOSPITAL_COMMUNITY)
Admission: RE | Admit: 2022-07-15 | Discharge: 2022-07-15 | Disposition: A | Discharge status: Home / Self Care | Payer: BC Managed Care – PPO | Source: Ambulatory Visit | Attending: Family Medicine | Admitting: Family Medicine

## 2022-07-15 DIAGNOSIS — K5900 Constipation, unspecified: Secondary | ICD-10-CM

## 2022-07-15 DIAGNOSIS — R10825 Periumbilic rebound abdominal tenderness: Secondary | ICD-10-CM | POA: Diagnosis present

## 2022-08-05 ENCOUNTER — Encounter: Payer: Self-pay | Admitting: Internal Medicine

## 2022-08-06 ENCOUNTER — Telehealth: Payer: Self-pay | Admitting: Gastroenterology

## 2022-08-06 NOTE — Telephone Encounter (Signed)
Referral coordinator with [facility] called to say the patient called them today to say that last night she had really bad heartburn that it woke her from sleep.  When she eats/drinks she has a really sharp pain in the middle of her chest.  I told Ref. Coord. That I would make a note in her chart and would also add to the wait list to try and get her in sooner than 11/1.

## 2022-08-12 ENCOUNTER — Telehealth: Payer: Self-pay | Admitting: Psychiatry

## 2022-08-12 ENCOUNTER — Encounter: Payer: Self-pay | Admitting: Psychiatry

## 2022-08-12 ENCOUNTER — Ambulatory Visit (INDEPENDENT_AMBULATORY_CARE_PROVIDER_SITE_OTHER): Payer: BC Managed Care – PPO | Admitting: Psychiatry

## 2022-08-12 VITALS — BP 118/68 | HR 81 | Ht 62.0 in | Wt 160.0 lb

## 2022-08-12 DIAGNOSIS — J329 Chronic sinusitis, unspecified: Secondary | ICD-10-CM

## 2022-08-12 DIAGNOSIS — G43009 Migraine without aura, not intractable, without status migrainosus: Secondary | ICD-10-CM | POA: Diagnosis not present

## 2022-08-12 MED ORDER — RIZATRIPTAN BENZOATE 10 MG PO TABS: 10.0000 mg | ORAL_TABLET | ORAL | 11 refills | Status: DC | PRN

## 2022-08-12 MED ORDER — NORTRIPTYLINE HCL 25 MG PO CAPS: 25.0000 mg | ORAL_CAPSULE | Freq: Every day | ORAL | 11 refills | Status: DC

## 2022-08-12 NOTE — Progress Notes (Signed)
   CC:  headaches  Follow-up Visit  Last visit: 12/09/21  Brief HPI: 21 year old female with a history of asthma and iron deficiency anemia who follows in clinic for migraines.  At her last visit, nortriptyline was increased to 25 mg QHS and Maxalt was started for rescue.  Interval History: MRI brain 04/05/22 was unremarkable. EEG performed for intermittent head twitching on 04/01/22 was normal.  Headaches have improved with increased dose of nortriptyline. She currently has 1 headache per week. Maxalt works well for rescue and does not cause side effects. She is happy with her current level of headache control. Has not had any more episodes of head twitching.   Migraine days per month: 4 Headache free days per month: 26  Current Headache Regimen: Preventative: nortriptyline 25 mg QHS Abortive: Maxalt 10 mg PRN   Prior Therapies                                  Amitriptyline 25 mg QHS - lack of efficacy Nortriptyline 25 mg QHS Maxalt 10 mg PRN  Physical Exam:   Vital Signs: LMP 07/01/2022 (Exact Date)  GENERAL:  well appearing, in no acute distress, alert  SKIN:  Color, texture, turgor normal. No rashes or lesions HEAD:  Normocephalic/atraumatic. RESP: normal respiratory effort MSK:  No gross joint deformities.   NEUROLOGICAL: Mental Status: Alert, oriented to person, place and time, Follows commands, and Speech fluent and appropriate. Cranial Nerves: PERRL, face symmetric, no dysarthria, hearing grossly intact Motor: moves all extremities equally Gait: normal-based.  IMPRESSION: 22 year old female with a history of asthma and iron deficiency anemia who presents for follow up of migraines. She has had improvement in her headaches since increasing dose of nortriptyline. Offered to increase dose, however she is happy with her current headache control and would prefer to maintain current regimen at this time. Reviewed MRI images with her today. MRI with mucous retention  cysts, and mother does note that the patient gets frequent sinus infections. She requests a referral to ENT for sinusitis management.  PLAN: -Prevention: Continue nortriptyline 25 mg QHS -Rescue: Continue Maxalt 10 mg PRN -Referral to ENT placed at patient's request  Follow-up: 6 months  I spent a total of 15 minutes on the date of the service. Headache education was done. Discussed treatment options including preventive and acute medications.  Discussed medication side effects, adverse reactions and drug interactions. Written educational materials and patient instructions outlining all of the above were given.  Genia Harold, MD 08/12/22 2:44 PM

## 2022-08-12 NOTE — Telephone Encounter (Signed)
Referral for ENT faxed to Dr. Leta Baptist. Phone: [phone], Fax: [phone]

## 2022-08-13 NOTE — Telephone Encounter (Signed)
Attempted to fax referral several times. Have emailed to referral for ENT to [email or web address].

## 2022-08-17 DIAGNOSIS — R10825 Periumbilic rebound abdominal tenderness: Secondary | ICD-10-CM | POA: Insufficient documentation

## 2022-08-17 DIAGNOSIS — R1030 Lower abdominal pain, unspecified: Secondary | ICD-10-CM | POA: Insufficient documentation

## 2022-08-26 ENCOUNTER — Ambulatory Visit (INDEPENDENT_AMBULATORY_CARE_PROVIDER_SITE_OTHER): Payer: BC Managed Care – PPO | Admitting: Gastroenterology

## 2022-08-26 ENCOUNTER — Encounter: Payer: Self-pay | Admitting: Gastroenterology

## 2022-08-26 ENCOUNTER — Ambulatory Visit: Payer: BC Managed Care – PPO | Admitting: Gastroenterology

## 2022-08-26 VITALS — BP 125/81 | HR 90 | Temp 97.8°F | Ht 62.0 in | Wt 167.8 lb

## 2022-08-26 DIAGNOSIS — R1310 Dysphagia, unspecified: Secondary | ICD-10-CM | POA: Diagnosis not present

## 2022-08-26 DIAGNOSIS — K5900 Constipation, unspecified: Secondary | ICD-10-CM

## 2022-08-26 DIAGNOSIS — K219 Gastro-esophageal reflux disease without esophagitis: Secondary | ICD-10-CM | POA: Insufficient documentation

## 2022-08-26 DIAGNOSIS — E611 Iron deficiency: Secondary | ICD-10-CM

## 2022-08-26 MED ORDER — PANTOPRAZOLE SODIUM 20 MG PO TBEC: 20.0000 mg | DELAYED_RELEASE_TABLET | Freq: Every day | ORAL | 3 refills | Status: DC

## 2022-08-26 NOTE — Progress Notes (Deleted)
GI Office Note    Referring Provider: No ref. provider found Primary Care Physician:  Ponciano Ort, [facility]  Primary Gastroenterologist:  Chief Complaint   No chief complaint on file.    History of Present Illness   Allison Hayes is a 21 y.o. female presenting today      Acute abdomen/chest, September 2023: Moderate stool in the ascending and transverse colon.    Medications   Current Outpatient Medications  Medication Sig Dispense Refill   cetirizine (ZYRTEC ALLERGY) 10 MG tablet Take 1 tablet (10 mg total) by mouth daily. 30 tablet 0   Cholecalciferol (VITAMIN D-3) 25 MCG (1000 UT) CAPS Take by mouth at bedtime.     FEROSUL 325 (65 Fe) MG tablet Take 325 mg by mouth daily.     montelukast (SINGULAIR) 10 MG tablet Take 10 mg by mouth at bedtime.     norethindrone-ethinyl estradiol-FE (LOESTRIN FE) 1-20 MG-MCG tablet Take 1 tablet by mouth daily. Junel FE     nortriptyline (PAMELOR) 25 MG capsule Take 1 capsule (25 mg total) by mouth at bedtime. 30 capsule 11   rizatriptan (MAXALT) 10 MG tablet Take 1 tablet (10 mg total) by mouth as needed for migraine. May repeat in 2 hours if needed 10 tablet 11   triamcinolone (NASACORT) 55 MCG/ACT AERO nasal inhaler Place 2 sprays into the nose daily.     No current facility-administered medications for this visit.    Allergies   Allergies as of 08/26/2022   (No Known Allergies)    Past Medical History   Past Medical History:  Diagnosis Date   Asthma    Heart murmur     Past Surgical History   Past Surgical History:  Procedure Laterality Date   TYMPANOSTOMY TUBE PLACEMENT      Past Family History   Family History  Problem Relation Age of Onset   Migraines Mother    Allergic rhinitis Mother    Anxiety disorder Mother    Allergic rhinitis Father    Allergic rhinitis Sister    Allergic rhinitis Brother    Angioedema Neg Hx    Asthma Neg Hx    Atopy Neg Hx    Eczema Neg Hx    Urticaria Neg Hx     Immunodeficiency Neg Hx    Seizures Neg Hx    Autism Neg Hx    ADD / ADHD Neg Hx    Depression Neg Hx    Bipolar disorder Neg Hx    Schizophrenia Neg Hx     Past Social History   Social History   Socioeconomic History   Marital status: Single    Spouse name: Not on file   Number of children: 0   Years of education: Not on file   Highest education level: Not on file  Occupational History   Not on file  Tobacco Use   Smoking status: Never   Smokeless tobacco: Never  Substance and Sexual Activity   Alcohol use: No   Drug use: No   Sexual activity: Not on file  Other Topics Concern   Not on file  Social History Narrative   Lives at home with mom, dad and brother. She is planning on attending [employer]. She enjoys Public house manager, music and watching TV   Social Determinants of Health   Financial Resource Strain: Not on file  Food Insecurity: Not on file  Transportation Needs: Not on file  Physical Activity: Not on file  Stress:  Not on file  Social Connections: Not on file  Intimate Partner Violence: Not on file    Review of Systems   General: Negative for anorexia, weight loss, fever, chills, fatigue, weakness. Eyes: Negative for vision changes.  ENT: Negative for hoarseness, difficulty swallowing , nasal congestion. CV: Negative for chest pain, angina, palpitations, dyspnea on exertion, peripheral edema.  Respiratory: Negative for dyspnea at rest, dyspnea on exertion, cough, sputum, wheezing.  GI: See history of present illness. GU:  Negative for dysuria, hematuria, urinary incontinence, urinary frequency, nocturnal urination.  MS: Negative for joint pain, low back pain.  Derm: Negative for rash or itching.  Neuro: Negative for weakness, abnormal sensation, seizure, frequent headaches, memory loss,  confusion.  Psych: Negative for anxiety, depression, suicidal ideation, hallucinations.  Endo: Negative for unusual weight change.  Heme: Negative for bruising or  bleeding. Allergy: Negative for rash or hives.  Physical Exam   There were no vitals taken for this visit.   General: Well-nourished, well-developed in no acute distress.  Head: Normocephalic, atraumatic.   Eyes: Conjunctiva pink, no icterus. Mouth: Oropharyngeal mucosa moist and pink , no lesions erythema or exudate. Neck: Supple without thyromegaly, masses, or lymphadenopathy.  Lungs: Clear to auscultation bilaterally.  Heart: Regular rate and rhythm, no murmurs rubs or gallops.  Abdomen: Bowel sounds are normal, nontender, nondistended, no hepatosplenomegaly or masses,  no abdominal bruits or hernia, no rebound or guarding.   Rectal: *** Extremities: No lower extremity edema. No clubbing or deformities.  Neuro: Alert and oriented x 4 , grossly normal neurologically.  Skin: Warm and dry, no rash or jaundice.   Psych: Alert and cooperative, normal mood and affect.  Labs   *** Imaging Studies   No results found.  Assessment       PLAN   ***   Laureen Ochs. Bobby Rumpf, [place], [facility]

## 2022-08-26 NOTE — Patient Instructions (Signed)
We will arrange to have an upper endoscopy with possible stretching of your esophagus in the near future with Dr. Gala Romney.  Start pantoprazole 20 mg daily 30 minutes before breakfast to control your acid reflux.  Follow a GERD diet:  Avoid fried, fatty, greasy, spicy, citrus foods. Avoid caffeine and carbonated beverages. Avoid chocolate. Try eating 4-6 small meals a day rather than 3 large meals. Do not eat within 3 hours of laying down. Prop head of bed up on wood or bricks to create a 6 inch incline.  Swallowing precautions:  Eat slowly, take small bites, chew thoroughly, drink plenty of liquids throughout meals.  Avoid trough textures All meats should be chopped finely.  If something gets hung in your esophagus and will not come up or go down, proceed to the emergency room.    For constipation: Increase Colace (docusate sodium) to 100 mg twice daily. Start Benefiber 2 teaspoons daily x2 weeks, then increase to twice daily. Consume plenty of fruits, vegetables, whole grains to maintain adequate fiber intake. Drink at least 64 ounces of water daily. Try to get daily exercise, at least 30 minutes a day. If you have breakthrough constipation, you can try MiraLAX 1 capful (17 g) daily to twice daily until your bowels start moving well again. If you continue to struggle with constipation, please let me know.  We we will plan to see you back in the office after your upper endoscopy.  Do not hesitate to call sooner if you have questions or concerns.   Aliene Altes, PA-C [facility]

## 2022-08-26 NOTE — Progress Notes (Signed)
Referring Provider: Alanson Puls [facility] Primary Care Physician:  Alanson Puls, [facility] Primary Gastroenterologist:  Dr. Gala Romney  Chief Complaint  Patient presents with   Constipation    Constipation all the time. Lots of acid reflux.     HPI:   Allison Hayes is a 21 y.o. female presenting today at the request of  [facility] for constipation, abdominal pain, and heartburn.   Reviewed referral information.  Patient saw PCP 07/14/2022 complaining of constipation with lower abdominal pain.  She was taking Colace 100 mg daily.  Reported liquid stools 1-2/week, no relief with MiraLAX, enema, suppository, laxative.  Reviewed blood work included in referral.  Hemoglobin within normal limits at 14.0.  Kidney function, electrolytes within normal limits.  LFTs normal.  TSH normal.  Iron, iron saturation, ferritin, B12, folate within normal limits.  She was scheduled for x-ray of her abdomen and referred to GI for further evaluation.  DG chest/abdomen 07/15/22:  Moderate stool in ascending and transverse colon.   Today: Constipation:  Constipation dates back to childhood. Used to take MiraLAX a lot back then, but constipation seemed to resolve for a while. Started back about 6 months ago. Denies changes in diet/lifestyle/stress/medications at the time of constipation onset.  She has tried MiraLAX as needed, but not on a daily basis.  Did not feel that this helped very much.  She is also used laxatives, suppositories, enemas as needed but did not feel these were very effective either.  At this point, she is taking 1 stool softener daily and is having bowel movements every other day with soft, formed stools.  She does have breakthrough constipation about every other week where she can skip a few days between bowel movements.  She has skipped up to 2 weeks at a time in the past.  No brbpr.  Stools are quite dark on iron. Has been on iron since the beginning of the year.  States she was told  her iron was very low.  Not sure if her hemoglobin was low.  Denies history of menorrhagia.  Menstrual cycle last 3-4 days. No dark stools prior to starting iron.   Noted some upper abdominal pain when she is constipated. Resolves when bowels are moving well.  Eats plenty of fruits and vegetables.  Drinks 3, 16 oz bottles of water daily.    GERD:  Reflux daily for the last 2 months. No dietary changes. Eats fried foods 2-3 times a week. Symptoms start mid day and last into the night. No nausea or vomiting. Sensation of solid foods getting hung in her esophagus with pain. No regurgitation. Waits for it to pass. If drinking water, makes the pain worse. Doesn't take anything for reflux regularly. Has tried tums without much relief.   Rare ibuprofen.     Past Medical History:  Diagnosis Date   Asthma    Heart murmur    Migraines     Past Surgical History:  Procedure Laterality Date   TYMPANOSTOMY TUBE PLACEMENT      Current Outpatient Medications  Medication Sig Dispense Refill   cetirizine (ZYRTEC ALLERGY) 10 MG tablet Take 1 tablet (10 mg total) by mouth daily. 30 tablet 0   Cholecalciferol (VITAMIN D-3) 25 MCG (1000 UT) CAPS Take by mouth at bedtime.     FEROSUL 325 (65 Fe) MG tablet Take 325 mg by mouth daily.     montelukast (SINGULAIR) 10 MG tablet Take 10 mg by mouth at bedtime.  norethindrone-ethinyl estradiol-FE (LOESTRIN FE) 1-20 MG-MCG tablet Take 1 tablet by mouth daily. Junel FE     nortriptyline (PAMELOR) 25 MG capsule Take 1 capsule (25 mg total) by mouth at bedtime. 30 capsule 11   pantoprazole (PROTONIX) 20 MG tablet Take 1 tablet (20 mg total) by mouth daily before breakfast. 30 tablet 3   rizatriptan (MAXALT) 10 MG tablet Take 1 tablet (10 mg total) by mouth as needed for migraine. May repeat in 2 hours if needed 10 tablet 11   triamcinolone (NASACORT) 55 MCG/ACT AERO nasal inhaler Place 2 sprays into the nose daily.     No current facility-administered  medications for this visit.    Allergies as of 08/26/2022   (No Known Allergies)    Family History  Problem Relation Age of Onset   Migraines Mother    Allergic rhinitis Mother    Anxiety disorder Mother    Allergic rhinitis Father    Allergic rhinitis Sister    Allergic rhinitis Brother    Angioedema Neg Hx    Asthma Neg Hx    Atopy Neg Hx    Eczema Neg Hx    Urticaria Neg Hx    Immunodeficiency Neg Hx    Seizures Neg Hx    Autism Neg Hx    ADD / ADHD Neg Hx    Depression Neg Hx    Bipolar disorder Neg Hx    Schizophrenia Neg Hx    Colon cancer Neg Hx     Social History   Socioeconomic History   Marital status: Single    Spouse name: Not on file   Number of children: 0   Years of education: Not on file   Highest education level: Not on file  Occupational History   Not on file  Tobacco Use   Smoking status: Never   Smokeless tobacco: Never  Substance and Sexual Activity   Alcohol use: No   Drug use: No   Sexual activity: Not on file  Other Topics Concern   Not on file  Social History Narrative   Lives at home with mom, dad and brother. She is planning on attending [employer]. She enjoys Public house manager, music and watching TV   Social Determinants of Health   Financial Resource Strain: Not on file  Food Insecurity: Not on file  Transportation Needs: Not on file  Physical Activity: Not on file  Stress: Not on file  Social Connections: Not on file  Intimate Partner Violence: Not on file    Review of Systems: Gen: Denies any fever, chills, cold or flu like symptoms, pre-syncope, or syncope.  CV: Denies chest pain, heart palpitations..  Resp: Denies shortness of breath, cough.   GI: See HPI. GU : Denies urinary burning, urinary frequency, urinary hesitancy MS: Denies joint pain. Derm: Denies rash. Psych: Denies depression, anxiety. Heme: See HPI  Physical Exam: BP 125/81 (BP Location: Right Arm, Patient Position: Sitting, Cuff Size: Normal)    Pulse 90   Temp 97.8 F (36.6 C) (Temporal)   Ht 5\' 2"  (1.575 m)   Wt 167 lb 12.8 oz (76.1 kg)   LMP 08/10/2022 (Approximate)   SpO2 98%   BMI 30.69 kg/m  General:   Alert and oriented. Pleasant and cooperative. Well-nourished and well-developed.  Head:  Normocephalic and atraumatic. Eyes:  Without icterus, sclera clear and conjunctiva pink.  Ears:  Normal auditory acuity. Lungs:  Clear to auscultation bilaterally. No wheezes, rales, or rhonchi. No distress.  Heart:  S1, S2  present without murmurs appreciated.  Abdomen:  +BS, soft, non-tender and non-distended. No HSM noted. No guarding or rebound. No masses appreciated.  Rectal:  Deferred  Msk:  Symmetrical without gross deformities. Normal posture. Extremities:  Without edema. Neurologic:  Alert and  oriented x4;  grossly normal neurologically. Skin:  Intact without significant lesions or rashes. Psych:  Normal mood and affect.    Assessment:  21 year old female with history of asthma, migraines, heart murmur, presenting today as a new patient with chief complaint of constipation, reflux, and dysphagia.  Constipation: History of constipation as a child, now with recurrence for the last 6 months.  Notably, she was started on oral iron around the beginning of the year which is likely contributing.  She is taking 1 stool softener daily and tends to have bowel movements every other day but still with some breakthrough constipation every other week.  Associated upper abdominal pain when constipated that resolves with good bowel movements.  No BRBPR.  Stools are dark on iron. Suspect CIC and medication side effect secondary to iron.   GERD:  New onset over the last 2 months with daily symptoms. Denies dietary/lifestyle changes/weight gain prior to onset though she does consume common reflux triggers fairly routinely, and I suspect this is contributing. Unable to rule out H pylori. Notably she reports being diagnosed with iron deficiency  in early 2023 of unknown etiology.   Dysphagia:  New onset solid food dysphagia, associated pain in her chest when foods are hung. No regurgitation. Has to wait for symptoms to spontaneously resolve as drinking will worse the pain she is experiencing. May have esophagitis, esophageal web, ring, stricture related to GERD and/or EOE.   Iron deficiency:  Patient reports diagnosis of iron deficiency at the beginning of the year. Not sure if she was ever anemic. Has been on oral iron since that time. Most recent labs in September included in referral shows iron panel and hemoglobin within normal limits. Kidney function also normal. Etiology of iron deficiency is unclear. Denies history of menorrhagia. No brbpr. Stools have been dark since being on iron. She does have GERD and dysphagia as discussed above and will need an EGD which will help evaluate for upper GI contributors to iron deficiency including H pylori and celiac disease.  I am requesting labs from the last year for review.     Plan:  Proceed with upper endoscopy +/- dilation with propofol by Dr. Jena Gauss in near future. The risks, benefits, and alternatives have been discussed with the patient in detail. The patient states understanding and desires to proceed.  ASA 2 Urine pregnancy test prior to EGD Start Pantoprazole 20 mg daily.  Counseled on GERD diet/lifestyle.  Separate written instructions provided on AVS. Dysphagia precautions discussed.  Separate written instructions provided on AVS. Increase Colace to 100 mg twice daily. Start Benefiber 2 teaspoons daily x2 weeks, then increase to twice daily. Consume plenty of fruits, vegetables, whole grains to maintain adequate fiber intake. Drink at least 64 ounces of water daily. Goal of 30 minutes of exercise daily. May take MiraLAX 17 g daily to twice daily for breakthrough constipation. Patient will let me know if she continues to struggle with constipation despite recommendations  given. Request labs from the last year from PCP.  Follow-up after EGD.   Ermalinda Memos, PA-C [facility] 08/26/2022

## 2022-09-01 ENCOUNTER — Telehealth (INDEPENDENT_AMBULATORY_CARE_PROVIDER_SITE_OTHER): Payer: Self-pay | Admitting: *Deleted

## 2022-09-01 DIAGNOSIS — R1310 Dysphagia, unspecified: Secondary | ICD-10-CM

## 2022-09-01 NOTE — Telephone Encounter (Signed)
Called pt. Scheduled for egd +/-dil with Dr. Gala Romney, asa 2 on 12/11 at 7:30am. Aware will mail instructions and will need urine preg prior.   Checked carelon and no PA required

## 2022-10-02 ENCOUNTER — Other Ambulatory Visit (HOSPITAL_COMMUNITY)
Admission: RE | Admit: 2022-10-02 | Discharge: 2022-10-02 | Disposition: A | Discharge status: Home / Self Care | Payer: BC Managed Care – PPO | Source: Ambulatory Visit | Attending: Internal Medicine | Admitting: Internal Medicine

## 2022-10-02 DIAGNOSIS — J45909 Unspecified asthma, uncomplicated: Secondary | ICD-10-CM | POA: Diagnosis not present

## 2022-10-02 DIAGNOSIS — K5900 Constipation, unspecified: Secondary | ICD-10-CM | POA: Diagnosis not present

## 2022-10-02 DIAGNOSIS — D509 Iron deficiency anemia, unspecified: Secondary | ICD-10-CM | POA: Diagnosis not present

## 2022-10-02 DIAGNOSIS — K2210 Ulcer of esophagus without bleeding: Secondary | ICD-10-CM | POA: Diagnosis not present

## 2022-10-02 DIAGNOSIS — R1314 Dysphagia, pharyngoesophageal phase: Secondary | ICD-10-CM | POA: Diagnosis present

## 2022-10-02 DIAGNOSIS — R079 Chest pain, unspecified: Secondary | ICD-10-CM | POA: Diagnosis not present

## 2022-10-02 DIAGNOSIS — R1310 Dysphagia, unspecified: Secondary | ICD-10-CM

## 2022-10-02 DIAGNOSIS — K449 Diaphragmatic hernia without obstruction or gangrene: Secondary | ICD-10-CM | POA: Diagnosis not present

## 2022-10-02 DIAGNOSIS — K2100 Gastro-esophageal reflux disease with esophagitis, without bleeding: Secondary | ICD-10-CM | POA: Diagnosis not present

## 2022-10-02 LAB — PREGNANCY, URINE: Preg Test, Ur: NEGATIVE

## 2022-10-05 ENCOUNTER — Ambulatory Visit (HOSPITAL_COMMUNITY)
Admission: RE | Admit: 2022-10-05 | Discharge: 2022-10-05 | Disposition: A | Discharge status: Home / Self Care | Payer: BC Managed Care – PPO | Attending: Internal Medicine | Admitting: Internal Medicine

## 2022-10-05 ENCOUNTER — Encounter (HOSPITAL_COMMUNITY): Payer: Self-pay | Admitting: Internal Medicine

## 2022-10-05 ENCOUNTER — Other Ambulatory Visit: Payer: Self-pay

## 2022-10-05 ENCOUNTER — Ambulatory Visit (HOSPITAL_COMMUNITY): Payer: BC Managed Care – PPO | Admitting: Certified Registered Nurse Anesthetist

## 2022-10-05 ENCOUNTER — Encounter (HOSPITAL_COMMUNITY)
Admission: RE | Disposition: A | Discharge status: Home / Self Care | Payer: Self-pay | Source: Home / Self Care | Attending: Internal Medicine

## 2022-10-05 ENCOUNTER — Telehealth: Payer: Self-pay

## 2022-10-05 DIAGNOSIS — R1310 Dysphagia, unspecified: Secondary | ICD-10-CM

## 2022-10-05 DIAGNOSIS — R1314 Dysphagia, pharyngoesophageal phase: Secondary | ICD-10-CM | POA: Diagnosis not present

## 2022-10-05 DIAGNOSIS — K2210 Ulcer of esophagus without bleeding: Secondary | ICD-10-CM | POA: Insufficient documentation

## 2022-10-05 DIAGNOSIS — R079 Chest pain, unspecified: Secondary | ICD-10-CM | POA: Insufficient documentation

## 2022-10-05 DIAGNOSIS — K2100 Gastro-esophageal reflux disease with esophagitis, without bleeding: Secondary | ICD-10-CM | POA: Diagnosis not present

## 2022-10-05 DIAGNOSIS — J45909 Unspecified asthma, uncomplicated: Secondary | ICD-10-CM | POA: Insufficient documentation

## 2022-10-05 DIAGNOSIS — K5900 Constipation, unspecified: Secondary | ICD-10-CM | POA: Insufficient documentation

## 2022-10-05 DIAGNOSIS — D509 Iron deficiency anemia, unspecified: Secondary | ICD-10-CM | POA: Insufficient documentation

## 2022-10-05 DIAGNOSIS — K449 Diaphragmatic hernia without obstruction or gangrene: Secondary | ICD-10-CM | POA: Insufficient documentation

## 2022-10-05 HISTORY — PX: BIOPSY: SHX5522

## 2022-10-05 HISTORY — PX: MALONEY DILATION: SHX5535

## 2022-10-05 HISTORY — PX: ESOPHAGOGASTRODUODENOSCOPY (EGD) WITH PROPOFOL: SHX5813

## 2022-10-05 SURGERY — ESOPHAGOGASTRODUODENOSCOPY (EGD) WITH PROPOFOL
Anesthesia: General

## 2022-10-05 MED ORDER — PROPOFOL 10 MG/ML IV BOLUS
INTRAVENOUS | Status: DC | PRN
  Administered 2022-10-05: 50 mg via INTRAVENOUS
  Administered 2022-10-05: 100 mg via INTRAVENOUS

## 2022-10-05 MED ORDER — PANTOPRAZOLE SODIUM 40 MG PO TBEC: 40.0000 mg | DELAYED_RELEASE_TABLET | Freq: Every day | ORAL | 11 refills | Status: DC

## 2022-10-05 MED ORDER — PROPOFOL 500 MG/50ML IV EMUL
INTRAVENOUS | Status: AC
  Filled 2022-10-05: qty 50

## 2022-10-05 MED ORDER — PROPOFOL 500 MG/50ML IV EMUL
INTRAVENOUS | Status: DC | PRN
  Administered 2022-10-05: 225 ug/kg/min via INTRAVENOUS

## 2022-10-05 MED ORDER — LACTATED RINGERS IV SOLN: INTRAVENOUS | Status: DC

## 2022-10-05 NOTE — Op Note (Signed)
Allison Hayes Patient Name: Allison Hayes Procedure Date: 10/05/2022 7:13 AM MRN: [id number] Date of Birth: 2001-01-08 Attending MD: Allison Hayes , MD, [id number] CSN: [id number] Age: 21 Admit Type: Outpatient Procedure:                Upper GI endoscopy Indications:              Dysphagia, Unexplained chest pain Providers:                Allison Pac, MD, Allison Ream. Thomasena Edis RN, RN,                            Allison Hayes Technician, Pensions consultant Referring MD:              Medicines:                Propofol per Anesthesia Complications:            . Finally, biopsies of the second portion of the                            duodenum taken for histologic study.                           No immediate complications. Estimated Blood Loss:     Estimated blood loss was minimal. Procedure:                Pre-Anesthesia Assessment:                           - Prior to the procedure, a History and Physical                            was performed, and patient medications and                            allergies were reviewed. The patient's tolerance of                            previous anesthesia was also reviewed. The risks                            and benefits of the procedure and the sedation                            options and risks were discussed with the patient.                            All questions were answered, and informed consent                            was obtained. Prior Anticoagulants: The patient has                            taken no anticoagulant or antiplatelet agents. ASA  Grade Assessment: II - A patient with mild systemic                            disease. After reviewing the risks and benefits,                            the patient was deemed in satisfactory condition to                            undergo the procedure.                           After obtaining informed consent, the endoscope was                             passed under direct vision. Throughout the                            procedure, the patient's blood pressure, pulse, and                            oxygen saturations were monitored continuously. The                            GIF-H190 ([id number]) scope was introduced through the                            mouth, and advanced to the second part of duodenum. Scope In: 7:45:56 AM Scope Out: 7:54:00 AM Total Procedure Duration: 0 hours 8 minutes 4 seconds  Findings:      Single 5 mm linear distal esophageal erosion. Tubular esophagus appeared       normal otherwise. Gastric cavity empty. Tiny hiatal hernia. Normal       gastric mucosa patent pylorus.      The duodenal bulb and second portion of the duodenum were normal. Scope       was withdrawn and a 50 [place] Maloney dilators passed full insertion       easily. A look back revealed no apparent complication related to this       maneuver. Subsequently biopsies of mid distal esophagus were taken for       histologic study Impression:               - Mild erosive reflux esophagitis. Status post                            Allison Hayes dilation. Status post esophageal biopsy.                           -Tiny hiatal hernia. Normal duodenal bulb and                            second portion of the duodenum.                           -Status post duodenal biopsy. Moderate Sedation:  Moderate (conscious) sedation was personally administered by an       anesthesia professional. The following parameters were monitored: oxygen       saturation, heart rate, blood pressure, respiratory rate, EKG, adequacy       of pulmonary ventilation, and response to care. Recommendation:           - Patient has a contact number available for                            emergencies. The signs and symptoms of potential                            delayed complications were discussed with the                            patient. Return to normal activities tomorrow.                             Written discharge instructions were provided to the                            patient.                           - Advance diet as tolerated.                           - Continue present medications. Increase Protonix                            to 40 mg daily. Follow-up on pathology report                           - Return to my office in 3 months. Procedure Code(s):        --- Professional ---                           [phone], Esophagogastroduodenoscopy, flexible,                            transoral; diagnostic, including collection of                            specimen(s) by brushing or washing, when performed                            (separate procedure) Diagnosis Code(s):        --- Professional ---                           R13.10, Dysphagia, unspecified                           R07.9, Chest pain, unspecified CPT copyright 2022 American Medical Association. All rights reserved. The codes documented in this report are preliminary and upon coder review may  be revised to meet current compliance  requirements. Allison Friendsobert M. Anureet Bruington, MD Allison Pacobert Michael Caitland Porchia, MD 10/05/2022 8:11:00 AM This report has been signed electronically. Number of Addenda: 0

## 2022-10-05 NOTE — Discharge Instructions (Addendum)
EGD Discharge instructions Please read the instructions outlined below and refer to this sheet in the next few weeks. These discharge instructions provide you with general information on caring for yourself after you leave the hospital. Your doctor may also give you specific instructions. While your treatment has been planned according to the most current medical practices available, unavoidable complications occasionally occur. If you have any problems or questions after discharge, please call your doctor. ACTIVITY You may resume your regular activity but move at a slower pace for the next 24 hours.  Take frequent rest periods for the next 24 hours.  Walking will help expel (get rid of) the air and reduce the bloated feeling in your abdomen.  No driving for 24 hours (because of the anesthesia (medicine) used during the test).  You may shower.  Do not sign any important legal documents or operate any machinery for 24 hours (because of the anesthesia used during the test).  NUTRITION Drink plenty of fluids.  You may resume your normal diet.  Begin with a light meal and progress to your normal diet.  Avoid alcoholic beverages for 24 hours or as instructed by your caregiver.  MEDICATIONS You may resume your normal medications unless your caregiver tells you otherwise.  WHAT YOU CAN EXPECT TODAY You may experience abdominal discomfort such as a feeling of fullness or "gas" pains.  FOLLOW-UP Your doctor will discuss the results of your test with you.  SEEK IMMEDIATE MEDICAL ATTENTION IF ANY OF THE FOLLOWING OCCUR: Excessive nausea (feeling sick to your stomach) and/or vomiting.  Severe abdominal pain and distention (swelling).  Trouble swallowing.  Temperature over 101 F (37.8 C).  Rectal bleeding or vomiting of blood.     You have reflux injury to your esophagus panel today.  Your esophagus was dilated and biopsied  No ulcer.  Your small intestine was also biopsied  Increase Protonix  to 40 mg daily 30 minutes before breakfast-new prescription being provided through the office.  Further recommendations to follow pending review of lab report  Office visit with Ermalinda Memos in 3 months--OFFICE WILL CALL WITH APPOINTMENT  At patient request I called Virgel Manifold at [phone] -reviewed findings and recommendations

## 2022-10-05 NOTE — Telephone Encounter (Signed)
-----   Message from Corbin Ade, MD sent at 10/05/2022  8:04 AM EST ----- Patient to stop Protonix 20 mg daily; begin Protonix 40 mg daily 30 minutes before breakfast.  New prescription dispense 30 with 11 refills.

## 2022-10-05 NOTE — Anesthesia Preprocedure Evaluation (Signed)
Anesthesia Evaluation  Patient identified by MRN, date of birth, ID band Patient awake    Reviewed: Allergy & Precautions, H&P , NPO status , Patient's Chart, lab work & pertinent test results, reviewed documented beta blocker date and time   Airway Mallampati: II  TM Distance: >3 FB Neck ROM: full    Dental no notable dental hx.    Pulmonary asthma    Pulmonary exam normal breath sounds clear to auscultation       Cardiovascular Exercise Tolerance: Good negative cardio ROS  Rhythm:regular Rate:Normal     Neuro/Psych  Headaches  Anxiety      negative psych ROS   GI/Hepatic Neg liver ROS,GERD  Medicated,,  Endo/Other  negative endocrine ROS    Renal/GU negative Renal ROS  negative genitourinary   Musculoskeletal   Abdominal   Peds  Hematology negative hematology ROS (+)   Anesthesia Other Findings   Reproductive/Obstetrics negative OB ROS                             Anesthesia Physical Anesthesia Plan  ASA: 2  Anesthesia Plan: General   Post-op Pain Management:    Induction:   PONV Risk Score and Plan: Propofol infusion  Airway Management Planned:   Additional Equipment:   Intra-op Plan:   Post-operative Plan:   Informed Consent: I have reviewed the patients History and Physical, chart, labs and discussed the procedure including the risks, benefits and alternatives for the proposed anesthesia with the patient or authorized representative who has indicated his/her understanding and acceptance.     Dental Advisory Given  Plan Discussed with: CRNA  Anesthesia Plan Comments:        Anesthesia Quick Evaluation

## 2022-10-05 NOTE — Transfer of Care (Signed)
Immediate Anesthesia Transfer of Care Note  Patient: Allison Hayes  Procedure(s) Performed: ESOPHAGOGASTRODUODENOSCOPY (EGD) WITH PROPOFOL MALONEY DILATION BIOPSY  Patient Location: PACU  Anesthesia Type:General  Level of Consciousness: drowsy, patient cooperative, and responds to stimulation  Airway & Oxygen Therapy: Patient Spontanous Breathing  Post-op Assessment: Report given to RN, Post -op Vital signs reviewed and stable, Patient moving all extremities X 4, and Patient able to stick tongue midline  Post vital signs: Reviewed  Last Vitals:  Vitals Value Taken Time  BP 89/40 10/05/22 0759  Temp 36.7 C 10/05/22 0759  Pulse 97 10/05/22 0759  Resp 19 10/05/22 0759  SpO2 98 % 10/05/22 0759    Last Pain:  Vitals:   10/05/22 0759  TempSrc: Oral  PainSc:          Complications: No notable events documented.

## 2022-10-05 NOTE — Telephone Encounter (Signed)
Rx was sent to pharmacy on file.  

## 2022-10-05 NOTE — H&P (Signed)
@LOGO @   Primary Care Physician:  Pllc, [facility] Primary Gastroenterologist:  Dr. [facility]  Pre-Procedure History & Physical: HPI:  Allison Hayes is a 21 y.o. female here for further evaluation of esophageal dysphagia and chest pain.  Constipation better with the fiber.  Still has dysphagia and chest pain.  Past Medical History:  Diagnosis Date   Asthma    Heart murmur    Migraines     Past Surgical History:  Procedure Laterality Date   TYMPANOSTOMY TUBE PLACEMENT      Prior to Admission medications   Medication Sig Start Date End Date Taking? Authorizing Provider  cetirizine (ZYRTEC ALLERGY) 10 MG tablet Take 1 tablet (10 mg total) by mouth daily. Patient taking differently: Take 10 mg by mouth at bedtime. 08/18/20  Yes Avegno, 08/20/20, FNP  Cholecalciferol (VITAMIN D) 50 MCG (2000 UT) tablet Take 2,000 Units by mouth at bedtime.   Yes [provider]  docusate sodium (COLACE) 100 MG capsule Take 100 mg by mouth at bedtime.   Yes [provider]  FEROSUL 325 (65 Fe) MG tablet Take 325 mg by mouth at bedtime. 10/31/20  Yes [provider]  fluticasone (FLONASE) 50 MCG/ACT nasal spray Place 2 sprays into both nostrils at bedtime.   Yes [provider]  fluticasone-salmeterol (ADVAIR) 100-50 MCG/ACT AEPB Inhale 1 puff into the lungs 2 (two) times daily.   Yes [provider]  ibuprofen (ADVIL) 200 MG tablet Take 200 mg by mouth every 6 (six) hours as needed for moderate pain or headache.   Yes [provider]  montelukast (SINGULAIR) 10 MG tablet Take 10 mg by mouth at bedtime.   Yes [provider]  Norethindrone Acetate-Ethinyl Estradiol (JUNEL 1.5/30) 1.5-30 MG-MCG tablet Take 1 tablet by mouth at bedtime.   Yes [provider]  nortriptyline (PAMELOR) 25 MG capsule Take 1 capsule (25 mg total) by mouth at bedtime. 08/12/22  Yes 08/14/22, MD  pantoprazole (PROTONIX) 20 MG tablet Take 1 tablet  (20 mg total) by mouth daily before breakfast. 08/26/22  Yes 13/1/23, PA-C  rizatriptan (MAXALT) 10 MG tablet Take 1 tablet (10 mg total) by mouth as needed for migraine. May repeat in 2 hours if needed 08/12/22  Yes 08/14/22, MD    Allergies as of 09/01/2022   (No Known Allergies)    Family History  Problem Relation Age of Onset   Migraines Mother    Allergic rhinitis Mother    Anxiety disorder Mother    Allergic rhinitis Father    Allergic rhinitis Sister    Allergic rhinitis Brother    Angioedema Neg Hx    Asthma Neg Hx    Atopy Neg Hx    Eczema Neg Hx    Urticaria Neg Hx    Immunodeficiency Neg Hx    Seizures Neg Hx    Autism Neg Hx    ADD / ADHD Neg Hx    Depression Neg Hx    Bipolar disorder Neg Hx    Schizophrenia Neg Hx    Colon cancer Neg Hx     Social History   Socioeconomic History   Marital status: Single    Spouse name: Not on file   Number of children: 0   Years of education: Not on file   Highest education level: Not on file  Occupational History   Not on file  Tobacco Use   Smoking status: Never   Smokeless tobacco: Never  Vaping Use   Vaping Use: Never used  Substance and Sexual Activity   Alcohol use: No   Drug use: No   Sexual activity: Not on file  Other Topics Concern   Not on file  Social History Narrative   Lives at home with mom, dad and brother. She is planning on attending [employer]. She enjoys Public house manager, music and watching TV   Social Determinants of Health   Financial Resource Strain: Not on file  Food Insecurity: Not on file  Transportation Needs: Not on file  Physical Activity: Not on file  Stress: Not on file  Social Connections: Not on file  Intimate Partner Violence: Not on file    Review of Systems: See HPI, otherwise negative ROS  Physical Exam: BP 116/78   Pulse 84   Temp 98.5 F (36.9 C) (Oral)   Resp 12   Ht 5\' 3"  (1.6 m)   Wt 72.6 kg   SpO2 97%   BMI 28.34 kg/m  General:    Alert,  Well-developed, well-nourished, pleasant and cooperative in NAD Skin:  Intact without significant lesions or rashes. Eyes:  Sclera clear, no icterus.   Conjunctiva pink. Ears:  Normal auditory acuity. Nose:  No deformity, discharge,  or lesions. Mouth:  No deformity or lesions. Neck:  Supple; no masses or thyromegaly. No significant cervical adenopathy. Lungs:  Clear throughout to auscultation.   No wheezes, crackles, or rhonchi. No acute distress. Heart:  Regular rate and rhythm; no murmurs, clicks, rubs,  or gallops. Abdomen: Non-distended, normal bowel sounds.  Soft and nontender without appreciable mass or hepatosplenomegaly.  Pulses:  Normal pulses noted. Extremities:  Without clubbing or edema.  Impression/Plan: 21 year old lady with chest pain GERD esophageal dysphagia.  History of iron deficiency anemia. I have offered the patient a EGD with esophageal dilation as feasible/appropriate per plan. The risks, benefits, limitations, alternatives and imponderables have been reviewed with the patient. Potential for esophageal dilation, biopsy, etc. have also been reviewed.  Questions have been answered. All parties agreeable.      Notice: This dictation was prepared with Dragon dictation along with smaller phrase technology. Any transcriptional errors that result from this process are unintentional and may not be corrected upon review.

## 2022-10-05 NOTE — Anesthesia Postprocedure Evaluation (Signed)
Anesthesia Post Note  Patient: Allison Hayes  Procedure(s) Performed: ESOPHAGOGASTRODUODENOSCOPY (EGD) WITH PROPOFOL MALONEY DILATION BIOPSY  Patient location during evaluation: Phase II Anesthesia Type: General Level of consciousness: awake Pain management: pain level controlled Vital Signs Assessment: post-procedure vital signs reviewed and stable Respiratory status: spontaneous breathing and respiratory function stable Cardiovascular status: blood pressure returned to baseline and stable Postop Assessment: no headache and no apparent nausea or vomiting Anesthetic complications: no Comments: Late entry   No notable events documented.   Last Vitals:  Vitals:   10/05/22 0759 10/05/22 0805  BP: (!) 89/40 (!) 97/56  Pulse: 97   Resp: 19 18  Temp: 36.7 C   SpO2: 98% 100%    Last Pain:  Vitals:   10/05/22 0806  TempSrc:   PainSc: 0-No pain                 Windell Norfolk

## 2022-10-06 LAB — SURGICAL PATHOLOGY

## 2022-10-09 ENCOUNTER — Encounter: Payer: Self-pay | Admitting: Internal Medicine

## 2022-10-12 ENCOUNTER — Encounter (HOSPITAL_COMMUNITY): Payer: Self-pay | Admitting: Internal Medicine

## 2022-11-04 ENCOUNTER — Encounter: Payer: Self-pay | Admitting: Gastroenterology

## 2022-11-27 ENCOUNTER — Telehealth: Payer: Self-pay | Admitting: Gastroenterology

## 2022-11-27 NOTE — Telephone Encounter (Signed)
Noted  

## 2022-11-27 NOTE — Telephone Encounter (Signed)
We had requested labs from the last year from PCP for review due to reported history of iron deficiency.  Records at PCP sent over are the same lab results that I had already reviewed from September 2023.  Allison Hayes, can you see if patient has any labs including CBC and iron panel between September 2022 and September 2023. If so, I would like to have them for review.

## 2022-12-15 ENCOUNTER — Encounter: Payer: Self-pay | Admitting: Internal Medicine

## 2022-12-20 NOTE — Telephone Encounter (Signed)
Received additional labs from PCP.    Looks like ferritin was low at 13 in December 2021.  No additional labs from 2021.  01/22/2021: Hemoglobin 12.9  08/01/2021: Hemoglobin 13.1 Iron 96, iron saturation 32, TIBC 299 Creatinine 0.69  07/08/2022: Hemoglobin 14 Creatinine 0.7 Iron 138, iron saturation 42%, TIBC 327 Vitamin B12 316 Folate 12.5   Appears she had iron deficiency in 2021. Unknown if she had anemia. Patient reported only starting iron this year. Denied overt GI bleeding at the time of her recent OV. Recent EGD with mild erosive reflux esophagitis.  Esophageal biopsy with reflux changes, small bowel biopsy benign.  No additional recommendations at this time.  We can discuss further at her follow-up.

## 2022-12-24 ENCOUNTER — Encounter: Payer: Self-pay | Admitting: Radiology

## 2023-01-24 NOTE — Progress Notes (Unsigned)
Referring Provider: Alanson Puls [facility] Primary Care Physician:  [facility] Primary GI Physician: Dr. Gala Romney  No chief complaint on file.   HPI:   Allison Hayes is a 22 y.o. female with history of asthma, migraines, heart murmur, presenting today for follow-up of GERD, dysphagia, constipation.  Last seen in our office at the time of initial consult on 08/26/2022.  Reported constipation as a child that had resolved, but recurrent for the last 6 months.  She had been started on oral iron around the beginning of the year and suspected this to be contributory.  Associated upper abdominal pain that resolves with good bowel movements.  Using 1 stool softener daily, but still with breakthrough constipation.  No BRBPR or melena.  GERD was new over the last 2 months with daily symptoms.  Also with new onset solid food dysphagia with associated pain when food gets stuck.  Regarding the need for oral iron, she reported diagnosis of iron deficiency at the beginning of the year, not sure if she was anemic.  Planned for EGD to evaluate dysphagia, possible H. pylori, celiac disease, start pantoprazole 20 mg daily, increase Colace to 100 mg twice daily, start Benefiber, may use MiraLAX for breakthrough constipation, and requested labs from PCP.  EGD 10/05/2022 with mild erosive reflux esophagitis s/p dilation and biopsy, tiny hiatal hernia, normal examined duodenum s/p biopsy.  Recommended increasing Protonix to 40 mg daily.  Duodenal biopsy was benign.  Esophageal biopsy with reflux changes.  Received labs from PCP.  Patient had ferritin of 13 in December 2021.  Unknown hemoglobin at that time.  In March 2022, hemoglobin was normal at 12.9 and has remained normal since that time.  Iron panel within normal limits in October 2022 and again in September 2023.  Today:    Past Medical History:  Diagnosis Date   Asthma    Heart murmur    Migraines     Past Surgical History:  Procedure  Laterality Date   BIOPSY  10/05/2022   Procedure: BIOPSY;  Surgeon: Daneil Dolin, MD;  [facility];  Service: Endoscopy;;   ESOPHAGOGASTRODUODENOSCOPY (EGD) WITH PROPOFOL N/A 10/05/2022   Procedure: ESOPHAGOGASTRODUODENOSCOPY (EGD) WITH PROPOFOL;  Surgeon: Daneil Dolin, MD;  Location: [facility];  Service: Endoscopy;  Laterality: N/A;  7:30AM, ASA 2   MALONEY DILATION N/A 10/05/2022   Procedure: MALONEY DILATION;  Surgeon: Daneil Dolin, MD;  Location: [facility];  Service: Endoscopy;  Laterality: N/A;   TYMPANOSTOMY TUBE PLACEMENT      Current Outpatient Medications  Medication Sig Dispense Refill   cetirizine (ZYRTEC ALLERGY) 10 MG tablet Take 1 tablet (10 mg total) by mouth daily. (Patient taking differently: Take 10 mg by mouth at bedtime.) 30 tablet 0   Cholecalciferol (VITAMIN D) 50 MCG (2000 UT) tablet Take 2,000 Units by mouth at bedtime.     docusate sodium (COLACE) 100 MG capsule Take 100 mg by mouth at bedtime.     FEROSUL 325 (65 Fe) MG tablet Take 325 mg by mouth at bedtime.     fluticasone (FLONASE) 50 MCG/ACT nasal spray Place 2 sprays into both nostrils at bedtime.     fluticasone-salmeterol (ADVAIR) 100-50 MCG/ACT AEPB Inhale 1 puff into the lungs 2 (two) times daily.     ibuprofen (ADVIL) 200 MG tablet Take 200 mg by mouth every 6 (six) hours as needed for moderate pain or headache.     montelukast (SINGULAIR) 10  MG tablet Take 10 mg by mouth at bedtime.     Norethindrone Acetate-Ethinyl Estradiol (JUNEL 1.5/30) 1.5-30 MG-MCG tablet Take 1 tablet by mouth at bedtime.     nortriptyline (PAMELOR) 25 MG capsule Take 1 capsule (25 mg total) by mouth at bedtime. 30 capsule 11   pantoprazole (PROTONIX) 40 MG tablet Take 1 tablet (40 mg total) by mouth daily. 30 tablet 11   rizatriptan (MAXALT) 10 MG tablet Take 1 tablet (10 mg total) by mouth as needed for migraine. May repeat in 2 hours if needed 10 tablet 11   No current facility-administered  medications for this visit.    Allergies as of 01/25/2023   (No Known Allergies)    Family History  Problem Relation Age of Onset   Migraines Mother    Allergic rhinitis Mother    Anxiety disorder Mother    Allergic rhinitis Father    Allergic rhinitis Sister    Allergic rhinitis Brother    Esophageal cancer Maternal Great-grandmother    Angioedema Neg Hx    Asthma Neg Hx    Atopy Neg Hx    Eczema Neg Hx    Urticaria Neg Hx    Immunodeficiency Neg Hx    Seizures Neg Hx    Autism Neg Hx    ADD / ADHD Neg Hx    Depression Neg Hx    Bipolar disorder Neg Hx    Schizophrenia Neg Hx    Colon cancer Neg Hx     Social History   Socioeconomic History   Marital status: Single    Spouse name: Not on file   Number of children: 0   Years of education: Not on file   Highest education level: Not on file  Occupational History   Not on file  Tobacco Use   Smoking status: Never   Smokeless tobacco: Never  Vaping Use   Vaping Use: Never used  Substance and Sexual Activity   Alcohol use: No   Drug use: No   Sexual activity: Not on file  Other Topics Concern   Not on file  Social History Narrative   Lives at home with mom, dad and brother. She is planning on attending [employer]. She enjoys Journalist, newspaper, music and watching TV   Social Determinants of Health   Financial Resource Strain: Not on file  Food Insecurity: Not on file  Transportation Needs: Not on file  Physical Activity: Not on file  Stress: Not on file  Social Connections: Not on file    Review of Systems: Gen: Denies fever, chills, anorexia. Denies fatigue, weakness, weight loss.  CV: Denies chest pain, palpitations, syncope, peripheral edema, and claudication. Resp: Denies dyspnea at rest, cough, wheezing, coughing up blood, and pleurisy. GI: Denies vomiting blood, jaundice, and fecal incontinence.   Denies dysphagia or odynophagia. Derm: Denies rash, itching, dry skin Psych: Denies depression,  anxiety, memory loss, confusion. No homicidal or suicidal ideation.  Heme: Denies bruising, bleeding, and enlarged lymph nodes.  Physical Exam: There were no vitals taken for this visit. General:   Alert and oriented. No distress noted. Pleasant and cooperative.  Head:  Normocephalic and atraumatic. Eyes:  Conjuctiva clear without scleral icterus. Heart:  S1, S2 present without murmurs appreciated. Lungs:  Clear to auscultation bilaterally. No wheezes, rales, or rhonchi. No distress.  Abdomen:  +BS, soft, non-tender and non-distended. No rebound or guarding. No HSM or masses noted. Msk:  Symmetrical without gross deformities. Normal posture. Extremities:  Without edema. Neurologic:  Alert and  oriented x4 Psych:  Normal mood and affect.    Assessment:     Plan:  ***   Aliene Altes, PA-C [facility] 01/25/2023

## 2023-01-25 ENCOUNTER — Ambulatory Visit (INDEPENDENT_AMBULATORY_CARE_PROVIDER_SITE_OTHER): Payer: BC Managed Care – PPO | Admitting: Gastroenterology

## 2023-01-25 ENCOUNTER — Encounter: Payer: Self-pay | Admitting: Gastroenterology

## 2023-01-25 VITALS — BP 115/74 | HR 87 | Temp 97.8°F | Ht 63.0 in | Wt 161.0 lb

## 2023-01-25 DIAGNOSIS — R1310 Dysphagia, unspecified: Secondary | ICD-10-CM

## 2023-01-25 DIAGNOSIS — R790 Abnormal level of blood mineral: Secondary | ICD-10-CM | POA: Diagnosis not present

## 2023-01-25 DIAGNOSIS — K5900 Constipation, unspecified: Secondary | ICD-10-CM

## 2023-01-25 DIAGNOSIS — K219 Gastro-esophageal reflux disease without esophagitis: Secondary | ICD-10-CM | POA: Diagnosis not present

## 2023-01-25 NOTE — Patient Instructions (Signed)
Continue pantoprazole 40 mg daily 30 minutes before breakfast.  You can use over-the-counter Pepcid or Tums as needed for breakthrough heartburn.  If you start to have breakthrough 2 or 3 times a week, please let me know.  Follow a GERD diet:  Avoid fried, fatty, greasy, spicy, citrus foods. Avoid caffeine and carbonated beverages. Avoid chocolate. Try eating 4-6 small meals a day rather than 3 large meals. Do not eat within 3 hours of laying down. Prop head of bed up on wood or bricks to create a 6 inch incline.   Continue taking stool softener twice daily.  We will follow-up with you in 6 months or sooner if needed.  It was great to see you again today!   Aliene Altes, PA-C [facility]enter[facility]

## 2023-02-09 ENCOUNTER — Other Ambulatory Visit: Payer: Self-pay

## 2023-02-09 ENCOUNTER — Telehealth: Payer: Self-pay | Admitting: Neurology

## 2023-02-09 MED ORDER — RIZATRIPTAN BENZOATE 10 MG PO TABS: 10.0000 mg | ORAL_TABLET | ORAL | 6 refills | Status: DC | PRN

## 2023-02-09 NOTE — Telephone Encounter (Signed)
Sent in 6 refills 02/08/2023

## 2023-02-09 NOTE — Telephone Encounter (Signed)
Pt cancelled appointment with Sarah for 02/17/23 and was rescheduled to Sarah's next available which is 08/18/23 and appointment added to wait list. Pt is asking if her nortriptyline (PAMELOR) 25 MG capsule [[id number]] and rizatriptan (MAXALT) 10 MG tablet [[id number]] medications can be refilled until then

## 2023-02-17 ENCOUNTER — Ambulatory Visit: Payer: BC Managed Care – PPO | Admitting: Neurology

## 2023-06-22 ENCOUNTER — Encounter: Payer: Self-pay | Admitting: Gastroenterology

## 2023-07-08 ENCOUNTER — Ambulatory Visit: Payer: BC Managed Care – PPO | Admitting: Orthopedic Surgery

## 2023-07-15 ENCOUNTER — Other Ambulatory Visit: Payer: Self-pay | Admitting: Internal Medicine

## 2023-07-22 ENCOUNTER — Ambulatory Visit: Payer: BC Managed Care – PPO | Admitting: Orthopedic Surgery

## 2023-08-02 ENCOUNTER — Ambulatory Visit: Payer: BC Managed Care – PPO | Admitting: Orthopedic Surgery

## 2023-08-16 ENCOUNTER — Encounter: Payer: Self-pay | Admitting: Orthopedic Surgery

## 2023-08-16 ENCOUNTER — Other Ambulatory Visit (INDEPENDENT_AMBULATORY_CARE_PROVIDER_SITE_OTHER): Payer: BC Managed Care – PPO

## 2023-08-16 ENCOUNTER — Ambulatory Visit (INDEPENDENT_AMBULATORY_CARE_PROVIDER_SITE_OTHER): Payer: BC Managed Care – PPO | Admitting: Orthopedic Surgery

## 2023-08-16 VITALS — BP 116/62 | HR 64 | Ht 63.0 in | Wt 165.0 lb

## 2023-08-16 DIAGNOSIS — S63591A Other specified sprain of right wrist, initial encounter: Secondary | ICD-10-CM

## 2023-08-16 DIAGNOSIS — M25531 Pain in right wrist: Secondary | ICD-10-CM

## 2023-08-16 MED ORDER — MELOXICAM 7.5 MG PO TABS: 7.5000 mg | ORAL_TABLET | Freq: Every day | ORAL | 1 refills | Status: DC

## 2023-08-16 NOTE — Progress Notes (Signed)
Office Visit Note   Patient: Allison Hayes           Date of Birth: 18-Jan-2001           MRN: [id number] Visit Date: 08/16/2023 Requested by: Marylynn Pearson, FNP [id number] [place],  [place] [address] PCP: Ponciano Ort, [facility]   Assessment & Plan:   Encounter Diagnoses  Name Primary?   Pain in right wrist    Complex tear of triangular fibrocartilage of right wrist, initial encounter Yes    Meds ordered this encounter  Medications   meloxicam (MOBIC) 7.5 MG tablet    Sig: Take 1 tablet (7.5 mg total) by mouth daily.    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  No trauma.  Prior treatment intermittent ibuprofen and wrist sleeve              ROS: Negative   Images personally read and my interpretation : Normal  Visit Diagnoses:  1. Complex tear of triangular fibrocartilage of right wrist, initial encounter   2. Pain in right wrist      Follow-Up Instructions: Return in about 6 weeks (around 09/27/2023).    Objective: Vital Signs: BP 116/62   Pulse 64   Ht 5\' 3"  (1.6 m)   Wt 165 lb (74.8 kg)   BMI 29.23 kg/m   Physical Exam Vitals and nursing note reviewed.  Constitutional:      Appearance: Normal appearance.  HENT:     Head: Normocephalic and atraumatic.  Eyes:     General: No scleral icterus.       Right eye: No discharge.        Left eye: No discharge.     Extraocular Movements: Extraocular movements intact.     Conjunctiva/sclera: Conjunctivae normal.     Pupils: Pupils are equal, round, and reactive to light.  Cardiovascular:     Rate and  Rhythm: Normal rate.     Pulses: Normal pulses.  Skin:    General: Skin is warm and dry.     Capillary Refill: Capillary refill takes less than 2 seconds.  Neurological:     General: No focal deficit present.     Mental Status: She is alert and oriented to person, place, and time.  Psychiatric:        Mood and Affect: Mood normal.        Behavior: Behavior normal.        Thought Content: Thought content normal.        Judgment: Judgment normal.      Ortho Exam Right wrist Skin normal no incisions no erythema no bruising Tenderness on the volar aspect of the wrist and ulnar side of the wrist and on dorsal No forearm tenderness Normal range of motion with painful flexion only no pain with extension ulnar or radial deviation Grip strength normal Normal sensation  Specialty Comments:  No specialty comments available.  Imaging: DG Wrist Complete Right  Result Date: 08/16/2023 Right wrist ulnar-sided wrist pain AP  lateral oblique normal articulations normal bones no signs of arthritis or fracture no history or signs of oral trauma Normal right wrist     PMFS History: Patient Active Problem List   Diagnosis Date Noted   Gastroesophageal reflux disease 08/26/2022   Dysphagia 08/26/2022   Constipation 08/26/2022   Low iron 08/26/2022   Lower abdominal pain 08/17/2022   Rebound tenderness of umbilical region 08/17/2022   Acute pharyngitis 07/07/2022   Acute upper respiratory infection 07/07/2022   Overweight 02/03/2022   Iron deficiency 08/11/2021   Mixed hyperlipidemia 08/11/2021   Tension headache 09/12/2018   Anxiety state 09/12/2018   Frequent headaches 09/12/2018   Rhinitis medicamentosa 07/14/2016   Moderate persistent asthma 07/14/2016   Allergic rhinoconjunctivitis 07/14/2016   Other allergic rhinitis 07/03/2015   Past Medical History:  Diagnosis Date   Asthma    GERD (gastroesophageal reflux disease)    Heart murmur    Migraines     Family History   Problem Relation Age of Onset   Migraines Mother    Allergic rhinitis Mother    Anxiety disorder Mother    Allergic rhinitis Father    Allergic rhinitis Sister    Allergic rhinitis Brother    Esophageal cancer Maternal Great-grandmother    Angioedema Neg Hx    Asthma Neg Hx    Atopy Neg Hx    Eczema Neg Hx    Urticaria Neg Hx    Immunodeficiency Neg Hx    Seizures Neg Hx    Autism Neg Hx    ADD / ADHD Neg Hx    Depression Neg Hx    Bipolar disorder Neg Hx    Schizophrenia Neg Hx    Colon cancer Neg Hx     Past Surgical History:  Procedure Laterality Date   BIOPSY  10/05/2022   Procedure: BIOPSY;  Surgeon: Corbin Ade, MD;  [facility];  Service: Endoscopy;;   ESOPHAGOGASTRODUODENOSCOPY (EGD) WITH PROPOFOL N/A 10/05/2022   Procedure: ESOPHAGOGASTRODUODENOSCOPY (EGD) WITH PROPOFOL;  Surgeon: Corbin Ade, MD;  [facility];  Service: Endoscopy;  Laterality: N/A;  7:30AM, ASA 2   MALONEY DILATION N/A 10/05/2022   Procedure: MALONEY DILATION;  Surgeon: Corbin Ade, MD;  [facility];  Service: Endoscopy;  Laterality: N/A;   TYMPANOSTOMY TUBE PLACEMENT     Social History   Occupational History   Not on file  Tobacco Use   Smoking status: Never   Smokeless tobacco: Never  Vaping Use   Vaping status: Never Used  Substance and Sexual Activity   Alcohol use: No   Drug use: No   Sexual activity: Not Currently

## 2023-08-18 ENCOUNTER — Encounter: Payer: Self-pay | Admitting: Neurology

## 2023-08-18 ENCOUNTER — Ambulatory Visit (INDEPENDENT_AMBULATORY_CARE_PROVIDER_SITE_OTHER): Payer: BC Managed Care – PPO | Admitting: Neurology

## 2023-08-18 VITALS — BP 124/75 | HR 71 | Resp 14 | Ht 63.0 in

## 2023-08-18 DIAGNOSIS — G43709 Chronic migraine without aura, not intractable, without status migrainosus: Secondary | ICD-10-CM | POA: Diagnosis not present

## 2023-08-18 MED ORDER — RIZATRIPTAN BENZOATE 10 MG PO TABS: 10.0000 mg | ORAL_TABLET | ORAL | 11 refills | Status: DC | PRN

## 2023-08-18 MED ORDER — NORTRIPTYLINE HCL 25 MG PO CAPS: 25.0000 mg | ORAL_CAPSULE | Freq: Every day | ORAL | 3 refills | Status: DC

## 2023-08-18 NOTE — Patient Instructions (Signed)
I am glad you are doing well!!  We will continue current medications.  Please reach out if your headaches increase.  I will see you back in 1 year.  Thanks!!  Meds ordered this encounter  Medications   nortriptyline (PAMELOR) 25 MG capsule    Sig: Take 1 capsule (25 mg total) by mouth at bedtime.    Dispense:  90 capsule    Refill:  3   rizatriptan (MAXALT) 10 MG tablet    Sig: Take 1 tablet (10 mg total) by mouth as needed for migraine. May repeat in 2 hours if needed    Dispense:  10 tablet    Refill:  11

## 2023-08-18 NOTE — Progress Notes (Signed)
Patient: Allison Hayes Date of Birth: 10/25/2001  Reason for Visit: Follow up History from: Patient Primary Neurologist: [place]  ASSESSMENT AND PLAN 22 y.o. year old female   1.  Chronic migraine headache  -Under very good control, currently 1 migraine a month -Continue nortriptyline 25 mg at bedtime for migraine prevention -Continue Maxalt 10 mg as needed for acute migraine headache -Follow-up in 1 year via video visit with me  HISTORY OF PRESENT ILLNESS: Today 08/18/23 Migraines have reduced, doing good on nortriptyline 25 mg at bedtime. 1 migraine a month. Will take ibuprofen or Maxalt with excellent benefit within 30 minutes to 1 hour. No side effects from nortriptyline. She is not sure if she went to ENT.  Has not had any recurrent sinus infection.  She is at [facility].  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  08/12/22 Dr. Delena Bali Brief HPI: 22 year old female with a history of asthma and iron deficiency anemia who follows in clinic for migraines.   At her last visit, nortriptyline was increased to 25 mg QHS and Maxalt was started for rescue.   Interval History: MRI brain 04/05/22 was unremarkable. EEG performed for intermittent head twitching on 04/01/22 was normal.   Headaches have improved with increased dose of nortriptyline. She currently has 1 headache per week. Maxalt works well for rescue and does not cause side effects. She is happy with her current level of headache control. Has not had any more episodes of head twitching.     Migraine days per month: 4 Headache free days per month: 26   Current Headache Regimen: Preventative: nortriptyline 25 mg QHS Abortive: Maxalt 10 mg PRN     Prior Therapies                                  Amitriptyline 25 mg QHS - lack of efficacy Nortriptyline 25 mg QHS Maxalt 10 mg PRN  REVIEW OF SYSTEMS: Out of a complete 14 system review of symptoms, the patient complains only of the  following symptoms, and all other reviewed systems are negative.  See HPI  ALLERGIES: No Known Allergies  HOME MEDICATIONS: Outpatient Medications Prior to Visit  Medication Sig Dispense Refill   albuterol (VENTOLIN HFA) 108 (90 Base) MCG/ACT inhaler SMARTSIG:2 Puff(s) By Mouth Every 4-6 Hours PRN     BLISOVI FE 1/20 1-20 MG-MCG tablet Take 1 tablet by mouth daily.     cetirizine (ZYRTEC ALLERGY) 10 MG tablet Take 1 tablet (10 mg total) by mouth daily. (Patient taking differently: Take 10 mg by mouth at bedtime.) 30 tablet 0   Cholecalciferol (VITAMIN D) 50 MCG (2000 UT) tablet Take 2,000 Units by mouth at bedtime.     docusate sodium (COLACE) 100 MG capsule Take 100 mg by mouth in the morning and at bedtime.     FEROSUL 325 (65 Fe) MG tablet Take 325 mg by mouth at bedtime.     fluticasone (FLONASE) 50 MCG/ACT nasal spray Place 2 sprays into both nostrils at bedtime.     fluticasone-salmeterol (ADVAIR) 100-50 MCG/ACT AEPB Inhale 1 puff into the lungs 2 (two) times daily.     ibuprofen (ADVIL) 200 MG tablet Take 200 mg by mouth every 6 (six) hours as needed for moderate pain or headache.     montelukast (SINGULAIR) 10 MG tablet Take 10 mg by mouth at bedtime.  nortriptyline (PAMELOR) 25 MG capsule Take 1 capsule (25 mg total) by mouth at bedtime. 30 capsule 11   pantoprazole (PROTONIX) 40 MG tablet TAKE 1 TABLET(40 MG) BY MOUTH DAILY 30 tablet 11   rizatriptan (MAXALT) 10 MG tablet Take 1 tablet (10 mg total) by mouth as needed for migraine. May repeat in 2 hours if needed 10 tablet 6   meloxicam (MOBIC) 7.5 MG tablet Take 1 tablet (7.5 mg total) by mouth daily. 60 tablet 1   Norethindrone Acetate-Ethinyl Estradiol (JUNEL 1.5/30) 1.5-30 MG-MCG tablet Take 1 tablet by mouth at bedtime.     No facility-administered medications prior to visit.    PAST MEDICAL HISTORY: Past Medical History:  Diagnosis Date   Asthma    GERD (gastroesophageal reflux disease)    Heart murmur     Migraines     PAST SURGICAL HISTORY: Past Surgical History:  Procedure Laterality Date   BIOPSY  10/05/2022   Procedure: BIOPSY;  Surgeon: Corbin Ade, MD;  [facility];  Service: Endoscopy;;   ESOPHAGOGASTRODUODENOSCOPY (EGD) WITH PROPOFOL N/A 10/05/2022   Procedure: ESOPHAGOGASTRODUODENOSCOPY (EGD) WITH PROPOFOL;  Surgeon: Corbin Ade, MD;  [facility];  Service: Endoscopy;  Laterality: N/A;  7:30AM, ASA 2   MALONEY DILATION N/A 10/05/2022   Procedure: MALONEY DILATION;  Surgeon: Corbin Ade, MD;  [facility];  Service: Endoscopy;  Laterality: N/A;   TYMPANOSTOMY TUBE PLACEMENT      FAMILY HISTORY: Family History  Problem Relation Age of Onset   Migraines Mother    Allergic rhinitis Mother    Anxiety disorder Mother    Allergic rhinitis Father    Allergic rhinitis Sister    Allergic rhinitis Brother    Esophageal cancer Maternal Great-grandmother    Angioedema Neg Hx    Asthma Neg Hx    Atopy Neg Hx    Eczema Neg Hx    Urticaria Neg Hx    Immunodeficiency Neg Hx    Seizures Neg Hx    Autism Neg Hx    ADD / ADHD Neg Hx    Depression Neg Hx    Bipolar disorder Neg Hx    Schizophrenia Neg Hx    Colon cancer Neg Hx     SOCIAL HISTORY: Social History   Socioeconomic History   Marital status: Single    Spouse name: Not on file   Number of children: 0   Years of education: Not on file   Highest education level: Not on file  Occupational History   Not on file  Tobacco Use   Smoking status: Never   Smokeless tobacco: Never  Vaping Use   Vaping status: Never Used  Substance and Sexual Activity   Alcohol use: No   Drug use: No   Sexual activity: Not Currently  Other Topics Concern   Not on file  Social History Narrative   Lives at home with mom, dad and brother. She is planning on attending [employer]. She enjoys Public house manager, music and watching TV   Social Determinants of Health   Financial Resource Strain:  Not on file  Food Insecurity: Not on file  Transportation Needs: No Transportation Needs (07/19/2019)   Received from [facility], [facility]   [facility] - Transportation    Lack of Transportation (Medical): No    Lack of Transportation (Non-Medical): No  Physical Activity: Not on file  Stress: No Stress Concern Present (07/19/2019)   Received from [facility]   [employer] of Occupational Health - Occupational Stress Questionnaire    Feeling of Stress : Not at all  Social Connections: Unknown (12/23/2022)   [facility], [facility]   Social Network    Social Network: Not on file  Intimate Partner Violence: Unknown (12/23/2022)   Received from [facility], [facility]   HITS    Physically Hurt: Not on file    Insult or Talk Down To: Not on file    Threaten Physical Harm: Not on file    Scream or Curse: Not on file    PHYSICAL EXAM  Vitals:   08/18/23 1444  BP: 124/75  Pulse: 71  Resp: 14  Height: 5\' 3"  (1.6 m)   Body mass index is 29.23 kg/m.  Generalized: Well developed, in no acute distress  Neurological examination  Mentation: Alert oriented to time, place, history taking. Follows all commands speech and language fluent Cranial nerve II-XII: Pupils were equal round reactive to light. Extraocular movements were full, visual field were full on confrontational test. Facial sensation and strength were normal. Head turning and shoulder shrug  were normal and symmetric. Motor: The motor testing reveals 5 over 5 strength of all 4 extremities. Good symmetric motor tone is noted throughout.  Sensory: Sensory testing is intact to soft touch on all 4 extremities. No evidence of extinction is noted.  Coordination: Cerebellar testing reveals good finger-nose-finger and heel-to-shin bilaterally.  Gait and station: Gait is normal.  Reflexes: Deep tendon reflexes are symmetric and normal bilaterally.   DIAGNOSTIC DATA (LABS,  IMAGING, TESTING) - I reviewed patient records, labs, notes, testing and imaging myself where available.  Lab Results  Component Value Date   WBC 8.6 08/16/2011   HGB 12.8 08/16/2011   HCT 36.8 08/16/2011   MCV 84.4 08/16/2011   PLT 214 08/16/2011      Component Value Date/Time   NA 133 (L) 08/16/2011 0533   K 3.4 (L) 08/16/2011 0533   CL 98 08/16/2011 0533   CO2 25 08/16/2011 0533   GLUCOSE 125 (H) 08/16/2011 0533   BUN 11 08/16/2011 0533   CREATININE 0.46 (L) 08/16/2011 0533   CALCIUM 9.5 08/16/2011 0533   GFRNONAA NOT CALCULATED 08/16/2011 0533   GFRAA NOT CALCULATED 08/16/2011 0533   No results found for: "CHOL", "HDL", "LDLCALC", "LDLDIRECT", "TRIG", "CHOLHDL" No results found for: "HGBA1C" No results found for: "VITAMINB12" No results found for: "TSH"  Margie Ege, AGNP-C, DNP 08/18/2023, 3:06 PM [facility] [address], [address] [place], [place] [address] [phone]

## 2023-09-30 ENCOUNTER — Ambulatory Visit (INDEPENDENT_AMBULATORY_CARE_PROVIDER_SITE_OTHER): Payer: BC Managed Care – PPO | Admitting: Orthopedic Surgery

## 2023-09-30 DIAGNOSIS — S63591D Other specified sprain of right wrist, subsequent encounter: Secondary | ICD-10-CM | POA: Diagnosis not present

## 2023-09-30 NOTE — Progress Notes (Signed)
  Office Visit Note   Patient: Allison Hayes           Date of Birth: [birth year]           MRN: [id number] Visit Date: 09/30/2023 Requested by: Kathalene Frames [facility] [address] [place],  [place] [address] PCP: Ponciano Ort, [facility]   Chief Complaint  Patient presents with   Wrist Pain    R wrist TFCC f/u   No Known Allergies Encounter Diagnosis  Name Primary?   Complex tear of triangular fibrocartilage of right wrist, subsequent encounter Yes   There is no height or weight on file to calculate BMI.  Ms. Bautz is doing well she has no pain when she is out of her brace  Exam findings include normal range of motion in flexion extension radial ulnar deviation and rotation    IMAGING: No results found.  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   PROCEDURES: No procedures   Fuller Canada, MD  09/30/2023 3:33 PM

## 2023-09-30 NOTE — Patient Instructions (Signed)
Brace as needed

## 2024-02-23 NOTE — Progress Notes (Signed)
 Referring Provider: Bryna Car [facility] Primary Care Physician:  Bryna Car, [facility] Primary GI Physician: Dr. Riley Cheadle  Chief Complaint  Patient presents with   Constipation    Having problems with constipations     HPI:   Allison Hayes is a 23 y.o. female with history of  asthma, migraines, heart murmur, constipation, GERD, dysphagia, low ferritin, presenting today with chief complaint of constipation.   Last seen in the office 01/25/23.  Breakthrough GERD about once a week on pantoprazole  40 mg daily.  Diarrhea resolved.  Constipation well-controlled with Colace twice daily.  Recommended continuing pantoprazole  40 mg daily, okay to use Tums or Pepcid as needed, continue Colace 100 mg twice daily.  Discussed possibility of colonoscopy to evaluate low ferritin, but suspect that this was primarily secondary to monthly menstrual losses and patient preferred to hold off on colonoscopy and monitor.  Recommended 54-month follow-up.  Today:  Small incomplete bowel movements. Associated bloating. Has been taking MiraLAX once a day, but improvement. Has taken OTC laxatives prn. Taking colace nightly as well. No brbpr, melena, unintentional weight loss.   Reviewed most labs dated 02/04/2024.  CBC iron panel, B12, folate within normal limits.  No recent medication changes or new medications in general.   Last EGD 10/05/2022 with mild erosive reflux esophagitis s/p dilation and biopsy, tiny hiatal hernia, normal examined duodenum s/p biopsy.  Recommended increasing Protonix  to 40 mg daily.  Duodenal biopsy was benign.  Esophageal biopsy with reflux changes.   No prior colonoscopy.   No Fhx of colon cancer.   Past Medical History:  Diagnosis Date   Asthma    GERD (gastroesophageal reflux disease)    Heart murmur    Migraines     Past Surgical History:  Procedure Laterality Date   BIOPSY  10/05/2022   Procedure: BIOPSY;  Surgeon: Suzette Espy, MD;  [facility];   Service: Endoscopy;;   ESOPHAGOGASTRODUODENOSCOPY (EGD) WITH PROPOFOL  N/A 10/05/2022   Procedure: ESOPHAGOGASTRODUODENOSCOPY (EGD) WITH PROPOFOL ;  Surgeon: Suzette Espy, MD;  [facility];  Service: Endoscopy;  Laterality: N/A;  7:30AM, ASA 2   MALONEY DILATION N/A 10/05/2022   Procedure: MALONEY DILATION;  Surgeon: Suzette Espy, MD;  [facility];  Service: Endoscopy;  Laterality: N/A;   TYMPANOSTOMY TUBE PLACEMENT      Current Outpatient Medications  Medication Sig Dispense Refill   albuterol  (VENTOLIN  HFA) 108 (90 Base) MCG/ACT inhaler SMARTSIG:2 Puff(s) By Mouth Every 4-6 Hours PRN     BLISOVI FE 1/20 1-20 MG-MCG tablet Take 1 tablet by mouth daily.     cetirizine  (ZYRTEC  ALLERGY) 10 MG tablet Take 1 tablet (10 mg total) by mouth daily. (Patient taking differently: Take 10 mg by mouth at bedtime.) 30 tablet 0   Cholecalciferol (VITAMIN D) 50 MCG (2000 UT) tablet Take 2,000 Units by mouth at bedtime.     docusate sodium (COLACE) 100 MG capsule Take 100 mg by mouth in the morning and at bedtime.     fluticasone  (FLONASE ) 50 MCG/ACT nasal spray Place 2 sprays into both nostrils at bedtime.     fluticasone -salmeterol (ADVAIR) 100-50 MCG/ACT AEPB Inhale 1 puff into the lungs 2 (two) times daily.     ibuprofen (ADVIL) 200 MG tablet Take 200 mg by mouth every 6 (six) hours as needed for moderate pain or headache.     montelukast (SINGULAIR) 10 MG tablet Take 10 mg by mouth at bedtime.  nortriptyline  (PAMELOR ) 25 MG capsule Take 1 capsule (25 mg total) by mouth at bedtime. 90 capsule 3   pantoprazole  (PROTONIX ) 40 MG tablet TAKE 1 TABLET(40 MG) BY MOUTH DAILY 30 tablet 11   rizatriptan  (MAXALT ) 10 MG tablet Take 1 tablet (10 mg total) by mouth as needed for migraine. May repeat in 2 hours if needed 10 tablet 11   No current facility-administered medications for this visit.    Allergies as of 02/24/2024   (No Known Allergies)    Family History  Problem  Relation Age of Onset   Migraines Mother    Allergic rhinitis Mother    Anxiety disorder Mother    Allergic rhinitis Father    Allergic rhinitis Sister    Allergic rhinitis Brother    Esophageal cancer Maternal Great-grandmother    Angioedema Neg Hx    Asthma Neg Hx    Atopy Neg Hx    Eczema Neg Hx    Urticaria Neg Hx    Immunodeficiency Neg Hx    Seizures Neg Hx    Autism Neg Hx    ADD / ADHD Neg Hx    Depression Neg Hx    Bipolar disorder Neg Hx    Schizophrenia Neg Hx    Colon cancer Neg Hx     Social History   Socioeconomic History   Marital status: Single    Spouse name: Not on file   Number of children: 0   Years of education: Not on file   Highest education level: Not on file  Occupational History   Not on file  Tobacco Use   Smoking status: Never   Smokeless tobacco: Never  Vaping Use   Vaping status: Never Used  Substance and Sexual Activity   Alcohol use: No   Drug use: No   Sexual activity: Not Currently  Other Topics Concern   Not on file  Social History Narrative   Lives at home with mom, dad and brother. She is planning on attending [employer]. She enjoys Public house manager, music and watching TV   Social Drivers of Corporate investment banker Strain: Not on file  Food Insecurity: Not on file  Transportation Needs: No Transportation Needs (07/19/2019)   Received from [facility], [facility]   [facility] - Transportation    Lack of Transportation (Medical): No    Lack of Transportation (Non-Medical): No  Physical Activity: Not on file  Stress: No Stress Concern Present (07/19/2019)   Received from [facility], [facility]   [facility] of Occupational Health - Occupational Stress Questionnaire    Feeling of Stress : Not at all  Social Connections: Unknown (12/23/2022)   Received from [facility], [facility]   Social Network    Social Network: Not on file    Review of Systems: GI: Denies vomiting blood, jaundice, and  fecal incontinence.   Denies dysphagia or odynophagia. Heme:  See HPI  Physical Exam: BP 117/75 (BP Location: Right Arm, Patient Position: Sitting, Cuff Size: Normal)   Pulse 98   Temp 97.9 F (36.6 C) (Temporal)   Ht 5\' 3"  (1.6 m)   Wt 170 lb 6.4 oz (77.3 kg)   LMP 02/18/2024 (Approximate)   BMI 30.19 kg/m  General:   Alert and oriented. No distress noted. Pleasant and cooperative.  Head:  Normocephalic and atraumatic. Eyes:  Conjuctiva clear without scleral icterus. Abdomen:  +BS, soft, and non-distended. Mild TTP in LLQ.  No rebound or guarding. No  HSM or masses noted. Msk:  Symmetrical without gross deformities. Normal posture. Neurologic:  Alert and  oriented x4 Psych:  Normal mood and affect.    Assessment:  Chronic constipation:  CIC vs IBS-C.  Not actively managed with over-the-counter agents.  No alarm symptoms.  Will try her on Linzess 145 mcg daily.   Plan:  Stop OTC constipation medications.  Start Linzess 145 mcg daily.  Samples provided.  Requested progress report in 1 week. Follow-up in 3 months or sooner if needed.   Shana Daring, PA-C [facility] 02/24/2024

## 2024-02-24 ENCOUNTER — Encounter: Payer: Self-pay | Admitting: Gastroenterology

## 2024-02-24 ENCOUNTER — Ambulatory Visit: Admitting: Gastroenterology

## 2024-02-24 VITALS — BP 117/75 | HR 98 | Temp 97.9°F | Ht 63.0 in | Wt 170.4 lb

## 2024-02-24 DIAGNOSIS — K5909 Other constipation: Secondary | ICD-10-CM | POA: Diagnosis not present

## 2024-02-24 DIAGNOSIS — K5900 Constipation, unspecified: Secondary | ICD-10-CM

## 2024-02-24 NOTE — Patient Instructions (Signed)
 Start Linzess 145 mcg daily, 30 minutes before breakfast.  We are providing you with samples today.  Please call or send a MyChart message with a progress report in 1 week.  If it works well, I will send a prescription to your pharmacy.  We can adjust up or down if needed.   I will plan to see you back in the office in 3 months or sooner if needed.  Shana Daring, PA-C [facility]

## 2024-06-03 NOTE — Progress Notes (Addendum)
 Referring Provider: Roni [facility] Primary Care Physician:  Roni, [facility] Primary GI Physician: Dr. Shaaron  Chief Complaint  Patient presents with   Follow-up    Follow up. Questions about labs     HPI:   Allison Hayes is a 23 y.o. female  with history of  asthma, migraines, heart murmur, constipation, GERD, dysphagia, low ferritin, presenting today for follow-up of constipation.   Last seen in the office 02/24/24. Reported MiraLAX and colace not helpful for constipation. She was given samples of Linzess 145 mg daily and advised to call with progress report in 1 week. 3 mth follow-up.   No progress report received.    Today:  States she took samples of Linzess 145 mcg which did work, but after that, her bowels have been moving normally.  She thinks this is because she is also been eating healthier.  She started a diet last week.  She reports recent blood work with PCP showing alk phos elevation.  I was able to review her labs under [facility] completed 05/05/2024.  She was found to have alk phos of 131.  Other LFTs within normal limits.  LDL was also mildly elevated at 107.  She does not drink alcohol. No herbal supplements.   No abdominal distention, LE edema.   GERD remains well controlled.   Great uncle with liver cancer.  Otherwise, no family history of liver disease.  Past Medical History:  Diagnosis Date   Asthma    GERD (gastroesophageal reflux disease)    Heart murmur    Migraines     Past Surgical History:  Procedure Laterality Date   BIOPSY  10/05/2022   Procedure: BIOPSY;  Surgeon: Shaaron Lamar HERO, MD;  Location: [facility];  Service: Endoscopy;;   ESOPHAGOGASTRODUODENOSCOPY (EGD) WITH PROPOFOL  N/A 10/05/2022   Procedure: ESOPHAGOGASTRODUODENOSCOPY (EGD) WITH PROPOFOL ;  Surgeon: Shaaron Lamar HERO, MD;  Location: [facility];  Service: Endoscopy;  Laterality: N/A;  7:30AM, ASA 2   MALONEY DILATION N/A 10/05/2022   Procedure: MALONEY  DILATION;  Surgeon: Shaaron Lamar HERO, MD;  Location: [facility];  Service: Endoscopy;  Laterality: N/A;   TYMPANOSTOMY TUBE PLACEMENT      Current Outpatient Medications  Medication Sig Dispense Refill   albuterol  (VENTOLIN  HFA) 108 (90 Base) MCG/ACT inhaler SMARTSIG:2 Puff(s) By Mouth Every 4-6 Hours PRN     BLISOVI FE 1/20 1-20 MG-MCG tablet Take 1 tablet by mouth daily.     cetirizine  (ZYRTEC  ALLERGY) 10 MG tablet Take 1 tablet (10 mg total) by mouth daily. 30 tablet 0   Cholecalciferol (VITAMIN D) 50 MCG (2000 UT) tablet Take 2,000 Units by mouth at bedtime.     fluticasone  (FLONASE ) 50 MCG/ACT nasal spray Place 2 sprays into both nostrils at bedtime.     fluticasone -salmeterol (ADVAIR) 100-50 MCG/ACT AEPB Inhale 1 puff into the lungs 2 (two) times daily.     ibuprofen (ADVIL) 200 MG tablet Take 200 mg by mouth every 6 (six) hours as needed for moderate pain or headache.     nortriptyline  (PAMELOR ) 25 MG capsule Take 1 capsule (25 mg total) by mouth at bedtime. 90 capsule 3   pantoprazole  (PROTONIX ) 40 MG tablet TAKE 1 TABLET(40 MG) BY MOUTH DAILY 30 tablet 11   rizatriptan  (MAXALT ) 10 MG tablet Take 1 tablet (10 mg total) by mouth as needed for migraine. May repeat in 2 hours if needed 10 tablet 11   montelukast (SINGULAIR) 10 MG tablet  Take 10 mg by mouth at bedtime. (Patient not taking: Reported on 06/05/2024)     No current facility-administered medications for this visit.    Allergies as of 06/05/2024   (No Known Allergies)    Family History  Problem Relation Age of Onset   Migraines Mother    Allergic rhinitis Mother    Anxiety disorder Mother    Allergic rhinitis Father    Allergic rhinitis Sister    Allergic rhinitis Brother    Esophageal cancer Maternal Great-grandmother    Angioedema Neg Hx    Asthma Neg Hx    Atopy Neg Hx    Eczema Neg Hx    Urticaria Neg Hx    Immunodeficiency Neg Hx    Seizures Neg Hx    Autism Neg Hx    ADD / ADHD Neg Hx    Depression Neg  Hx    Bipolar disorder Neg Hx    Schizophrenia Neg Hx    Colon cancer Neg Hx     Social History   Socioeconomic History   Marital status: Single    Spouse name: Not on file   Number of children: 0   Years of education: Not on file   Highest education level: Not on file  Occupational History   Not on file  Tobacco Use   Smoking status: Never   Smokeless tobacco: Never  Vaping Use   Vaping status: Never Used  Substance and Sexual Activity   Alcohol use: No   Drug use: No   Sexual activity: Not Currently  Other Topics Concern   Not on file  Social History Narrative   Lives at home with mom, dad and brother. She is planning on attending [employer]. She enjoys Public house manager, music and watching TV   Social Drivers of Corporate investment banker Strain: Not on file  Food Insecurity: Not on file  Transportation Needs: No Transportation Needs (07/19/2019)   Received from [facility]   PRAPARE - Transportation    Lack of Transportation (Medical): No    Lack of Transportation (Non-Medical): No  Physical Activity: Not on file  Stress: No Stress Concern Present (07/19/2019)   Received from [facility] - Occupational Stress Questionnaire    Feeling of Stress : Not at all  Social Connections: Unknown (12/23/2022)   Received from [facility]   Social Network    Social Network: Not on file    Review of Systems: Gen: Denies fever, chills, cold or flulike symptoms, presyncope, syncope. GI: See HPI Heme: See HPI  Physical Exam: BP 124/79 (BP Location: Right Arm, Patient Position: Sitting, Cuff Size: Normal)   Pulse 84   Temp 97.7 F (36.5 C) (Temporal)   Ht 5' 3 (1.6 m)   Wt 175 lb 3.2 oz (79.5 kg)   LMP 06/02/2024 (Approximate)   BMI 31.04 kg/m  General:   Alert and oriented. No distress noted. Pleasant and cooperative.  Head:  Normocephalic and atraumatic. Eyes:  Conjuctiva clear without scleral icterus. Heart:   S1, S2 present without murmurs appreciated. Lungs:  Clear to auscultation bilaterally. No wheezes, rales, or rhonchi. No distress.  Abdomen:  +BS, soft, non-tender and non-distended. No rebound or guarding. No HSM or masses noted. Msk:  Symmetrical without gross deformities. Normal posture. Extremities:  Without edema. Neurologic:  Alert and  oriented x4 Psych:  Normal mood and affect.    Assessment:  23 y.o. female with history of  asthma, migraines, heart murmur, constipation, GERD, dysphagia, low ferritin, presenting today for follow-up of constipation.  Also discussed elevated alk phos.  Constipation: Resolved with dietary changes.  Elevated alkaline phosphatase Recent labs with PCP on 05/05/2024 showed mild alk phos elevation of 131.  Other LFTs within normal limits.  No prior alk phos elevation.  No alcohol use.  No symptoms of decompensated liver disease.  I do note that LDL was also elevated on recent labs and patient reports consuming fried/fatty foods fairly frequently up until about 1 week ago when she started a diet.  It is possible that alk phos elevation is related to fatty meal consumption. As elevation is very mild, encouraged her to continue with dietary changes, weight loss efforts, increased activity levels, and recommended rechecking LFTs as well as GGT in 8 weeks.  If persistent elevation, consider additional workup.  GERD: Remains well-controlled on pantoprazole  40 mg daily.   Plan:  HFP and GGT in 8 weeks. Recommend 1-2# weight loss per week until ideal body weight through exercise & diet. Low fat/cholesterol diet.   Avoid sweets, sodas, fruit juices, sweetened beverages like tea, etc. Gradually increase exercise from 15 min daily up to 1 hr per day 5 days/week. Continue to avoid alcohol. Continue pantoprazole  40 mg daily. Follow-up in 6 months or sooner if needed.   Josette Centers, PA-C [facility] 06/05/2024   Addendum:  Patient's mother  reached out today to update family history. Reports patient's great uncle had stomach and bone cancer, not liver cancer. Her maternal grandpa passed away at 27 of liver cancer. Mom with fatty liver disease.   Recommended continuing with current plan to recheck alk phos and GGT in 8 weeks. If persistent elevation, will pursue additional work-up.   Josette Centers, PA-C [facility] 06/06/2024

## 2024-06-05 ENCOUNTER — Ambulatory Visit (INDEPENDENT_AMBULATORY_CARE_PROVIDER_SITE_OTHER): Admitting: Gastroenterology

## 2024-06-05 ENCOUNTER — Telehealth: Payer: Self-pay | Admitting: Gastroenterology

## 2024-06-05 ENCOUNTER — Encounter: Payer: Self-pay | Admitting: Gastroenterology

## 2024-06-05 ENCOUNTER — Other Ambulatory Visit: Payer: Self-pay | Admitting: *Deleted

## 2024-06-05 VITALS — BP 124/79 | HR 84 | Temp 97.7°F | Ht 63.0 in | Wt 175.2 lb

## 2024-06-05 DIAGNOSIS — K5900 Constipation, unspecified: Secondary | ICD-10-CM

## 2024-06-05 DIAGNOSIS — K219 Gastro-esophageal reflux disease without esophagitis: Secondary | ICD-10-CM | POA: Diagnosis not present

## 2024-06-05 DIAGNOSIS — R748 Abnormal levels of other serum enzymes: Secondary | ICD-10-CM

## 2024-06-05 NOTE — Patient Instructions (Signed)
 I am glad that constipation has resolved.  Due to mildly elevated alkaline phosphatase, we will plan to recheck your liver enzymes as well as GGT in 8 weeks.  If alk phos remains elevated, we will need to consider additional workup.  For now, I recommend the following:  Recommend 1-2# weight loss per week until ideal body weight through exercise & diet. Low fat/cholesterol diet.   Avoid sweets, sodas, fruit juices, sweetened beverages like tea, etc. Gradually increase exercise from 15 min daily up to 1 hr per day 5 days/week.  Continue to avoid alcohol use.    Josette Centers, PA-C [facility]

## 2024-06-05 NOTE — Telephone Encounter (Signed)
 Please place patient on recall for 4-month follow-up with Charmaine Melia, NP.

## 2024-07-17 ENCOUNTER — Emergency Department (HOSPITAL_COMMUNITY)
Admission: EM | Admit: 2024-07-17 | Discharge: 2024-07-17 | Disposition: A | Discharge status: Home / Self Care | Attending: Emergency Medicine | Admitting: Emergency Medicine

## 2024-07-17 ENCOUNTER — Encounter (HOSPITAL_COMMUNITY): Payer: Self-pay | Admitting: *Deleted

## 2024-07-17 ENCOUNTER — Emergency Department (HOSPITAL_COMMUNITY)

## 2024-07-17 ENCOUNTER — Other Ambulatory Visit: Payer: Self-pay

## 2024-07-17 DIAGNOSIS — Z7951 Long term (current) use of inhaled steroids: Secondary | ICD-10-CM | POA: Insufficient documentation

## 2024-07-17 DIAGNOSIS — R0789 Other chest pain: Secondary | ICD-10-CM | POA: Insufficient documentation

## 2024-07-17 DIAGNOSIS — R42 Dizziness and giddiness: Secondary | ICD-10-CM | POA: Diagnosis not present

## 2024-07-17 DIAGNOSIS — R000 Tachycardia, unspecified: Secondary | ICD-10-CM

## 2024-07-17 DIAGNOSIS — J45909 Unspecified asthma, uncomplicated: Secondary | ICD-10-CM | POA: Insufficient documentation

## 2024-07-17 LAB — CBC
HCT: 41.3 % (ref 36.0–46.0)
Hemoglobin: 14 g/dL (ref 12.0–15.0)
MCH: 30.2 pg (ref 26.0–34.0)
MCHC: 33.9 g/dL (ref 30.0–36.0)
MCV: 89.2 fL (ref 80.0–100.0)
Platelets: 310 K/uL (ref 150–400)
RBC: 4.63 MIL/uL (ref 3.87–5.11)
RDW: 12.1 % (ref 11.5–15.5)
WBC: 5.6 K/uL (ref 4.0–10.5)
nRBC: 0 % (ref 0.0–0.2)

## 2024-07-17 LAB — BASIC METABOLIC PANEL WITH GFR
Anion gap: 14 (ref 5–15)
BUN: 7 mg/dL (ref 6–20)
CO2: 19 mmol/L — ABNORMAL LOW (ref 22–32)
Calcium: 9.1 mg/dL (ref 8.9–10.3)
Chloride: 105 mmol/L (ref 98–111)
Creatinine, Ser: 0.59 mg/dL (ref 0.44–1.00)
GFR, Estimated: >60 mL/min (ref >60)
Glucose, Bld: 111 mg/dL — ABNORMAL HIGH (ref 70–99)
Potassium: 3.4 mmol/L — ABNORMAL LOW (ref 3.5–5.1)
Sodium: 138 mmol/L (ref 135–145)

## 2024-07-17 LAB — TROPONIN I (HIGH SENSITIVITY)
Troponin I (High Sensitivity): <2 ng/L (ref <18)
Troponin I (High Sensitivity): <2 ng/L (ref <18)

## 2024-07-17 LAB — D-DIMER, QUANTITATIVE: D-Dimer, Quant: 0.44 ug{FEU}/mL (ref 0.00–0.50)

## 2024-07-17 LAB — LIPASE, BLOOD: Lipase: 39 U/L (ref 11–51)

## 2024-07-17 MED ORDER — LACTATED RINGERS IV BOLUS
1000.0000 mL | Freq: Once | INTRAVENOUS | Status: AC
  Administered 2024-07-17: 1000 mL via INTRAVENOUS

## 2024-07-17 MED ORDER — SODIUM CHLORIDE 0.9 % IV BOLUS
1000.0000 mL | Freq: Once | INTRAVENOUS | Status: AC
  Administered 2024-07-17: 1000 mL via INTRAVENOUS

## 2024-07-17 MED ORDER — KETOROLAC TROMETHAMINE 15 MG/ML IJ SOLN
15.0000 mg | Freq: Once | INTRAMUSCULAR | Status: AC
  Administered 2024-07-17: 15 mg via INTRAVENOUS
  Filled 2024-07-17: qty 1

## 2024-07-17 NOTE — ED Notes (Signed)
 Patient care taken, no complaints at this time. Alert and oriented and said chest pain is good.

## 2024-07-17 NOTE — ED Provider Notes (Signed)
 [place] EMERGENCY DEPARTMENT AT [facility] Provider Note   CSN: [id number] Arrival date & time: 07/17/24  1606     Patient presents with: Chest Pain   Allison Hayes is a 23 y.o. female.  She has history of asthma, frequent headaches.  Presents to the ER today for evaluation of left anterior chest pain rating to the left arm and left upper back that started this morning around noon on her way to class.  She states she was walking to class and got very lightheaded when the pain started going into the left arm while she was trying to write with her left hand she became worried and decided to come to the ER for evaluation.  She states the pain is worse with taking a deep breath.  She feels lightheaded and can feel her heart pounding.  She denies nausea or vomiting.  She does take oral contraceptives, denies lower extremity swelling or pain, she does not smoke, she has never had pain like this in the past.  She has not had any caffeine today or other stimulants, does not use drugs, does not smoke, she has only drank water today.  She has not had any medications for her symptoms.    Chest Pain      Prior to Admission medications   Medication Sig Start Date End Date Taking? Authorizing Provider  albuterol  (VENTOLIN  HFA) 108 (90 Base) MCG/ACT inhaler SMARTSIG:2 Puff(s) By Mouth Every 4-6 Hours PRN 08/11/23   [provider]  BLISOVI FE 1/20 1-20 MG-MCG tablet Take 1 tablet by mouth daily. 06/18/23   [provider]  cetirizine  (ZYRTEC  ALLERGY) 10 MG tablet Take 1 tablet (10 mg total) by mouth daily. 08/18/20   Avegno, Komlanvi S, FNP  Cholecalciferol (VITAMIN D) 50 MCG (2000 UT) tablet Take 2,000 Units by mouth at bedtime.    [provider]  fluticasone  (FLONASE ) 50 MCG/ACT nasal spray Place 2 sprays into both nostrils at bedtime.    [provider]  fluticasone -salmeterol (ADVAIR) 100-50 MCG/ACT AEPB Inhale 1 puff into the lungs 2 (two) times  daily.    [provider]  ibuprofen (ADVIL) 200 MG tablet Take 200 mg by mouth every 6 (six) hours as needed for moderate pain or headache.    [provider]  montelukast (SINGULAIR) 10 MG tablet Take 10 mg by mouth at bedtime. Patient not taking: Reported on 06/05/2024    [provider]  nortriptyline  (PAMELOR ) 25 MG capsule Take 1 capsule (25 mg total) by mouth at bedtime. 08/18/23   Gayland Lauraine PARAS, NP  pantoprazole  (PROTONIX ) 40 MG tablet TAKE 1 TABLET(40 MG) BY MOUTH DAILY 07/16/23   Rudy Josette RAMAN, PA-C  rizatriptan  (MAXALT ) 10 MG tablet Take 1 tablet (10 mg total) by mouth as needed for migraine. May repeat in 2 hours if needed 08/18/23   Gayland Lauraine PARAS, NP    Allergies: Patient has no known allergies.    Review of Systems  Cardiovascular:  Positive for chest pain.    Updated Vital Signs BP (!) 156/93   Pulse (!) 126   Temp 98.8 F (37.1 C) (Oral)   Resp (!) 24   Ht 5' 3 (1.6 m)   Wt 79.4 kg   LMP 06/28/2024 (Exact Date)   SpO2 100%   BMI 31.00 kg/m   Physical Exam Vitals and nursing note reviewed.  Constitutional:      General: She is not in acute distress.    Appearance: She  is well-developed.  HENT:     Head: Normocephalic and atraumatic.  Eyes:     Conjunctiva/sclera: Conjunctivae normal.  Cardiovascular:     Rate and Rhythm: Regular rhythm. Tachycardia present.     Heart sounds: No murmur heard. Pulmonary:     Effort: Pulmonary effort is normal. No respiratory distress.     Breath sounds: Normal breath sounds.  Abdominal:     Palpations: Abdomen is soft.     Tenderness: There is no abdominal tenderness.  Musculoskeletal:        General: No swelling.     Cervical back: Neck supple.     Right lower leg: No tenderness. No edema.     Left lower leg: No tenderness. No edema.  Skin:    General: Skin is warm and dry.     Capillary Refill: Capillary refill takes less than 2 seconds.  Neurological:     General: No focal deficit  present.     Mental Status: She is alert and oriented to person, place, and time.  Psychiatric:        Mood and Affect: Mood normal.     (all labs ordered are listed, but only abnormal results are displayed) Labs Reviewed  BASIC METABOLIC PANEL WITH GFR  CBC  D-DIMER, QUANTITATIVE  TROPONIN I (HIGH SENSITIVITY)    EKG: None  Radiology: No results found.   Procedures   Medications Ordered in the ED  lactated ringers  bolus 1,000 mL (has no administration in time range)                                    Medical Decision Making Differential diagnosis includes but not limited to ACS, PE, pneumonia, pneumothorax, myocarditis, pericarditis, other  ED course: Is here for left upper chest pain that goes into her back and left arm.  This started today around noon.  She has been having lightheadedness as well and has been tachycardic.  She does have pleuritic pain.  Wells score is 4.5 which is moderate risk.  Will order D-dimer, CBC, BMP and troponin are pending.  I discussed with patient that we will give her fluids and Toradol  for pain.  If D-dimer is positive will order CTA.  Chest x-ray was ordered and interpreted by me, no pulm edema, no infiltrate, no pneumothorax.  Agree with radiology read.  Signed out to Tinnie Matter, PA-C  Amount and/or Complexity of Data Reviewed Labs: ordered. Radiology: ordered.  Risk Prescription drug management.        Final diagnoses:  None    ED Discharge Orders     None          Suellen Sherran DELENA DEVONNA 07/17/24 1711    Garrick Charleston, MD 07/17/24 2200

## 2024-07-17 NOTE — Discharge Instructions (Addendum)
 Thank you for letting us  evaluate you today.  Your 2 heart enzymes were negative.  Do not think that you are having acute heart injury.  Your vital signs are within normal limits.  Your EKG was normal.  Your dimer was negative so I do not think that you are having a blood clot.  Your chest x-ray is without fluid nor pneumonia.  I have provided you with cardiology for all for further management of tachycardia, chest pain.  Please follow-up with them.  Return to Emergency Department for experience chest pain, shortness of breath, worsening symptoms

## 2024-07-17 NOTE — ED Triage Notes (Addendum)
 Here by POV from home for CP, L sided, radiates to back, Reports HR high since onset, onset around 1200. Rates 8/10. Endorses sx of sob, dizziness, light headed and general weakness. HR 123 at this time. Pt flushed. Denies NVD, fever, syncope, fever or confusion. Alert, NAD, calm, interactive. Steady gait. No meds PTA. No h/o same. Denies long distance travel, recent surgery, or smoking. Takes COC/ BC. H/o fast HR before, has seen cards in the past and placed on holter monitor a few years ago.

## 2024-07-17 NOTE — ED Notes (Signed)
 Walked patient around the nurses desk and her heart rate went from 85 to 120's consistently. Oxygen stayed at 98%

## 2024-07-17 NOTE — ED Provider Notes (Signed)
 Received patient in signout from previous provider pending completion of ED workup, labs.  See her note.  In short, patient presents to emergency department for evaluation of left-sided chest pain that radiates into back and left arm that started this morning around noon while driving to class.  Is on OCP.  Denies cough, trauma  ED workup notable for tachycardia of 126 bpm, troponin negative, dimer negative.  EKG sinus tach at 126 bpm with no ST or T wave abnormalities.  No pedal edema does not appear fluid overloaded  Provided to IVF for tachycardia improving heart rate to 97 bpm.  Of note, was evaluated by [facility] pediatric cardiology in 2020 for tachycardia.  Had a Holter monitor with no V. tach, SVT noted.  Has not followed up with them since.  Delta troponin negative.  Ambulates without difficulty, complaints of chest pain, shortness of breath.  No hypoxia with ambulation.  Chest pain has resolved while in ED  I provided her with cardiology follow-up for further management of chest pain, tachycardia  Discussed ED workup, disposition, return to ED precautions with patient who expresses understanding agrees with plan.  All questions answered to their satisfaction.  They are agreeable to plan.  Discharge instructions provided on paperwork     Minnie Tinnie BRAVO, PA 07/17/24 2110    Cleotilde Rogue, MD 07/18/24 [phone]

## 2024-07-18 NOTE — Progress Notes (Unsigned)
 Cardiology Clinic Note   Patient Name: Allison Hayes Date of Encounter: 07/21/2024  Primary Care Provider:  Roni [facility] Primary Cardiologist:  None  Patient Profile    Allison Hayes 23 year old female presents to the clinic today for an evaluation of her chest discomfort.  Past Medical History    Past Medical History:  Diagnosis Date   Asthma    GERD (gastroesophageal reflux disease)    Heart murmur    Migraines    Past Surgical History:  Procedure Laterality Date   BIOPSY  10/05/2022   Procedure: BIOPSY;  Surgeon: Shaaron Lamar HERO, MD;  [facility]: [facility];  Service: Endoscopy;;   ESOPHAGOGASTRODUODENOSCOPY (EGD) WITH PROPOFOL  N/A 10/05/2022   Procedure: ESOPHAGOGASTRODUODENOSCOPY (EGD) WITH PROPOFOL ;  Surgeon: Shaaron Lamar HERO, MD;  [facility]: [facility];  Service: Endoscopy;  Laterality: N/A;  7:30AM, ASA 2   MALONEY DILATION N/A 10/05/2022   Procedure: MALONEY DILATION;  Surgeon: Shaaron Lamar HERO, MD;  Location: [facility];  Service: Endoscopy;  Laterality: N/A;   TYMPANOSTOMY TUBE PLACEMENT      Allergies  No Known Allergies  History of Present Illness    Allison Hayes has a PMH of moderate persistent asthma, GERD, dysphagia, anxiety, iron deficiency anemia, hyperlipidemia, and chronic migraine.    She was seen and evaluated in the emergency department on 07/17/2024.  She reported left-sided chest pain that radiated to her back and left arm.  Pain appeared when she was driving to class.  She denied cough and trauma.  Her EKG showed sinus tachycardia 126 bpm with no ST or T wave abnormalities.  She was not noted to have lower extremity edema.  She received IV fluids and her heart rate improved to 97 bpm.  She noted that she had been evaluated by [facility] pediatric cardiology in 2020 for tachycardia.  She wore a cardiac monitor which showed no V. tach, SVT.  She was lost to follow-up.  Her cardiac troponins were negative.  She was able to ambulate  without difficulty.  Her chest pain resolved in the emergency department.  She denied caffeine intake or stimulants.  She denied recreational drug use.  She does not smoke.  She denied ever having symptoms like this previously.  She does take oral contraceptive therapy.  Her blood pressure was noted to be 156/93.  She was referred to cardiology for outpatient evaluation.   She presents to the clinic today for evaluation and states she continues to notice symptoms.  She notes that yesterday she had sharp pain that was on the left side of her chest that radiated down her arm and lasted for about 10 minutes.  She notes that she was sitting in class.  We reviewed her emergency department visit and her past cardiac history.  She expressed understanding.  She presents with her mom.  She notes that she occasionally has dizziness and palpitations with standing up.  This happens 3-4 times per week.  She notices palpitations with chest pain 2 times per week.  Chest discomfort usually dissipates on its own with rest.  She stays somewhat well-hydrated and notes that she drinks about 72 ounces per day.  She does report that she has some stress and anxiety.  We reviewed options for treatment and further prognostication.  I will have her avoid triggers for palpitations, increase her physical activity (low intensity longer duration), increase hydration recommend about 100 220 ounces per day.  I will order 7-day cardiac  event monitor and echocardiogram and plan follow-up in around 2 months.  I will also give her the mindfulness stress reduction sheet.  Today she denies  shortness of breath, lower extremity edema,melena, hematuria, and hemoptysis.     Home Medications    Prior to Admission medications   Medication Sig Start Date End Date Taking? Authorizing Provider  BLISOVI FE 1/20 1-20 MG-MCG tablet Take 1 tablet by mouth daily. 06/18/23   [provider]  cetirizine  (ZYRTEC  ALLERGY) 10 MG tablet Take 1  tablet (10 mg total) by mouth daily. 08/18/20   Avegno, Komlanvi S, FNP  fluticasone  (FLONASE ) 50 MCG/ACT nasal spray Place 2 sprays into both nostrils at bedtime.    [provider]  ibuprofen (ADVIL) 200 MG tablet Take 200 mg by mouth every 6 (six) hours as needed for moderate pain or headache.    [provider]  nortriptyline  (PAMELOR ) 25 MG capsule Take 1 capsule (25 mg total) by mouth at bedtime. 08/18/23   Gayland Lauraine PARAS, NP  Omega-3 Fatty Acids (FISH OIL) 1000 MG CAPS Take 1 capsule by mouth at bedtime.    [provider]  pantoprazole  (PROTONIX ) 40 MG tablet TAKE 1 TABLET(40 MG) BY MOUTH DAILY Patient taking differently: Take 40 mg by mouth at bedtime. 07/16/23   Rudy Josette RAMAN, PA-C  rizatriptan  (MAXALT ) 10 MG tablet Take 1 tablet (10 mg total) by mouth as needed for migraine. May repeat in 2 hours if needed 08/18/23   Gayland Lauraine PARAS, NP  XULANE 150-35 MCG/24HR transdermal patch Place 1 patch onto the skin once a week. Patient not taking: Reported on 07/17/2024 07/17/24   [provider]    Family History    Family History  Problem Relation Age of Onset   Migraines Mother    Allergic rhinitis Mother    Anxiety disorder Mother    Allergic rhinitis Father    Allergic rhinitis Sister    Allergic rhinitis Brother    Esophageal cancer Maternal Great-grandmother    Angioedema Neg Hx    Asthma Neg Hx    Atopy Neg Hx    Eczema Neg Hx    Urticaria Neg Hx    Immunodeficiency Neg Hx    Seizures Neg Hx    Autism Neg Hx    ADD / ADHD Neg Hx    Depression Neg Hx    Bipolar disorder Neg Hx    Schizophrenia Neg Hx    Colon cancer Neg Hx    She indicated that her mother is alive. She indicated that her father is alive. She indicated that her sister is alive. She indicated that her brother is alive. She indicated that her maternal grandmother is alive. She indicated that her maternal grandfather is deceased. She indicated that her paternal grandmother  is alive. She indicated that her paternal grandfather is alive. She indicated that the status of her neg hx is unknown. She indicated that her maternal great-grandmother is deceased.  Social History    Social History   Socioeconomic History   Marital status: Single    Spouse name: Not on file   Number of children: 0   Years of education: Not on file   Highest education level: Not on file  Occupational History   Not on file  Tobacco Use   Smoking status: Never   Smokeless tobacco: Never  Vaping Use   Vaping status: Never Used  Substance and Sexual Activity   Alcohol use: No   Drug use: No  Sexual activity: Not Currently  Other Topics Concern   Not on file  Social History Narrative   Lives at home with mom, dad and brother. She is planning on attending [employer]. She enjoys Public house manager, music and watching TV   Social Drivers of Corporate investment banker Strain: Not on file  Food Insecurity: Not on file  Transportation Needs: No Transportation Needs (07/19/2019)   Received from [facility]   PRAPARE - Transportation    Lack of Transportation (Medical): No    Lack of Transportation (Non-Medical): No  Physical Activity: Not on file  Stress: No Stress Concern Present (07/19/2019)   Received from [facility]ational [facility] - Occupational Stress Questionnaire    Feeling of Stress : Not at all  Social Connections: Unknown (12/23/2022)   Received from [facility]   Social Network    Social Network: Not on file  Intimate Partner Violence: Unknown (12/23/2022)   Received from [facility]   HITS    Physically Hurt: Not on file    Insult or Talk Down To: Not on file    Threaten Physical Harm: Not on file    Scream or Curse: Not on file     Review of Systems    General:  No chills, fever, night sweats or weight changes.  Cardiovascular:  No chest pain, dyspnea on exertion, edema, orthopnea, palpitations, paroxysmal nocturnal  dyspnea. Dermatological: No rash, lesions/masses Respiratory: No cough, dyspnea Urologic: No hematuria, dysuria Abdominal:   No nausea, vomiting, diarrhea, bright red blood per rectum, melena, or hematemesis Neurologic:  No visual changes, wkns, changes in mental status. All other systems reviewed and are otherwise negative except as noted above.  Physical Exam    VS:  BP 122/70   Pulse (!) 106   Ht 5' 3 (1.6 m)   Wt 175 lb (79.4 kg)   LMP 06/28/2024 (Exact Date)   SpO2 97%   BMI 31.00 kg/m  , BMI Body mass index is 31 kg/m. GEN: Well nourished, well developed, in no acute distress. HEENT: normal. Neck: Supple, no JVD, carotid bruits, or masses. Cardiac: RRR, no murmurs, rubs, or gallops. No clubbing, cyanosis, edema.  Radials/DP/PT 2+ and equal bilaterally.  Respiratory:  Respirations regular and unlabored, clear to auscultation bilaterally. GI: Soft, nontender, nondistended, BS + x 4. MS: no deformity or atrophy. Skin: warm and dry, no rash. Neuro:  Strength and sensation are intact. Psych: Normal affect.  Accessory Clinical Findings    Recent Labs: 07/17/2024: BUN 7; Creatinine, Ser 0.59; Hemoglobin 14.0; Platelets 310; Potassium 3.4; Sodium 138   Recent Lipid Panel No results found for: CHOL, TRIG, HDL, CHOLHDL, VLDL, LDLCALC, LDLDIRECT       ECG personally reviewed by me today-none today.    EKG 07/18/2024  Sinus tachycardia 126 bpm      Assessment & Plan   1.  Chest discomfort-no chest pain today.  Developed chest discomfort during an episode of high heart rate while on her way to class on 07/17/2024.  Chest discomfort resolved while in the emergency department.  D-dimer negative, chest x-ray showed no acute findings.  Labs showed K of 3.4 but, were otherwise unremarkable.  Heart rate decreased with IV fluids.  Cardiac troponins negative.  Notes episode of sharp chest pain yesterday that lasted for about 10 minutes.  She was sitting in  class. Increase p.o. hydration-goal 100-120 mL daily Increase potassium in diet Order echocardiogram  Sinus tachycardia-heart rate  today 106 bpm.  Previously seen and evaluated by [facility] cardiology for tachycardia in 2020. Increase p.o. hydration Avoid triggers caffeine, chocolate, EtOH, dehydration etc. Mindfulness stress reduction sheet Increase physical activity-goal 150 minutes of moderate physical activity per week Order 7-day cardiac event monitor  Disposition: Follow-up with Dr. Sheena or me in 2 months.   Josefa HERO. Abdulkadir Emmanuel NP-C     07/21/2024, 10:24 AM [facility] [address] [place], [place] [address] Office [phone]    Notice: This dictation was prepared with Dragon dictation along with smaller phrase technology. Any transcriptional errors that result from this process are unintentional and may not be corrected upon review.   I spent 14 minutes examining this patient, reviewing medications, and using patient centered shared decision making involving their cardiac care.   I spent  20 minutes reviewing past medical history,  medications, and prior cardiac tests.

## 2024-07-21 ENCOUNTER — Ambulatory Visit

## 2024-07-21 ENCOUNTER — Ambulatory Visit: Attending: General Practice | Admitting: General Practice

## 2024-07-21 ENCOUNTER — Encounter: Payer: Self-pay | Admitting: General Practice

## 2024-07-21 VITALS — BP 122/70 | HR 106 | Ht 63.0 in | Wt 175.0 lb

## 2024-07-21 DIAGNOSIS — R000 Tachycardia, unspecified: Secondary | ICD-10-CM | POA: Diagnosis not present

## 2024-07-21 DIAGNOSIS — R0789 Other chest pain: Secondary | ICD-10-CM | POA: Diagnosis not present

## 2024-07-21 NOTE — Progress Notes (Unsigned)
Enrolled patient for a 7 day Zio XT monitor to be mailed to patients home   [place]obb to read

## 2024-07-21 NOTE — Patient Instructions (Signed)
 Medication Instructions:   Your physician recommends that you continue on your current medications as directed. Please refer to the Current Medication list given to you today.   *If you need a refill on your cardiac medications before your next appointment, please call your pharmacy*   Lab Work: NONE ORDERED  TODAY     If you have labs (blood work) drawn today and your tests are completely normal, you will receive your results only by: MyChart Message (if you have MyChart) OR A paper copy in the mail If you have any lab test that is abnormal or we need to change your treatment, we will call you to review the results.    Testing/Procedures: Your physician has requested that you have an echocardiogram. Echocardiography is a painless test that uses sound waves to create images of your heart. It provides your doctor with information about the size and shape of your heart and how well your heart's chambers and valves are working. This procedure takes approximately one hour. There are no restrictions for this procedure. Please do NOT wear cologne, perfume, aftershave, or lotions (deodorant is allowed). Please arrive 15 minutes prior to your appointment time.  Please note: We ask at that you not bring children with you during ultrasound (echo/ vascular) testing. Due to room size and safety concerns, children are not allowed in the ultrasound rooms during exams. Our front office staff cannot provide observation of children in our lobby area while testing is being conducted. An adult accompanying a patient to their appointment will only be allowed in the ultrasound room at the discretion of the ultrasound technician under special circumstances. We apologize for any inconvenience.  Your physician has recommended that you wear an event monitor. Event monitors are medical devices that record the heart's electrical activity. Doctors most often us  these monitors to diagnose arrhythmias. Arrhythmias are  problems with the speed or rhythm of the heartbeat. The monitor is a small, portable device. You can wear one while you do your normal daily activities. This is usually used to diagnose what is causing palpitations/syncope (passing out).    Follow-Up: At [facility], you and your health needs are our priority.  As part of our continuing mission to provide you with exceptional heart care, our providers are all part of one team.  This team includes your primary Cardiologist (physician) and Advanced Practice Providers or APPs (Physician Assistants and Nurse Practitioners) who all work together to provide you with the care you need, when you need it.  Your next appointment:  2 -3 month(s)   Provider:  Josefa Beauvais NP     We recommend signing up for the patient portal called MyChart.  Sign up information is provided on this After Visit Summary.  MyChart is used to connect with patients for Virtual Visits (Telemedicine).  Patients are able to view lab/test results, encounter notes, upcoming appointments, etc.  Non-urgent messages can be sent to your provider as well.   To learn more about what you can do with MyChart, go to [email or web address].   Other Instructions  ZIO XT- Long Term Monitor Instructions  Your physician has requested you wear a ZIO patch monitor for 14 days.  This is a single patch monitor. [employer] supplies one patch monitor per enrollment. Additional stickers are not available. Please do not apply patch if you will be having a Nuclear Stress Test,  Echocardiogram, Cardiac CT, MRI, or Chest Xray during the period you would be wearing the  monitor. The  patch cannot be worn during these tests. You cannot remove and re-apply the  ZIO XT patch monitor.  Your ZIO patch monitor will be mailed 3 day USPS to your address on file. It may take 3-5 days  to receive your monitor after you have been enrolled.  Once you have received your monitor, please review the enclosed  instructions. Your monitor  has already been registered assigning a specific monitor serial # to you.  Billing and Patient Assistance Program Information  We have supplied [employer] with any of your insurance information on file for billing purposes. [employer] offers a sliding scale Patient Assistance Program for patients that do not have  insurance, or whose insurance does not completely cover the cost of the ZIO monitor.  You must apply for the Patient Assistance Program to qualify for this discounted rate.  To apply, please call [employer] at [phone], select option 4, select option 2, ask to apply for  Patient Assistance Program. Meredeth will ask your household income, and how many people  are in your household. They will quote your out-of-pocket cost based on that information.  [employer] will also be able to set up a 81-month, interest-free payment plan if needed.  Applying the monitor   Shave hair from upper left chest.  Hold abrader disc by orange tab. Rub abrader in 40 strokes over the upper left chest as  indicated in your monitor instructions.  Clean area with 4 enclosed alcohol pads. Let dry.  Apply patch as indicated in monitor instructions. Patch will be placed under collarbone on left  side of chest with arrow pointing upward.  Rub patch adhesive wings for 2 minutes. Remove white label marked 1. Remove the white  label marked 2. Rub patch adhesive wings for 2 additional minutes.  While looking in a mirror, press and release button in center of patch. A small green light will  flash 3-4 times. This will be your only indicator that the monitor has been turned on.  Do not shower for the first 24 hours. You may shower after the first 24 hours.  Press the button if you feel a symptom. You will hear a small click. Record Date, Time and  Symptom in the Patient Logbook.  When you are ready to remove the patch, follow instructions on the last 2 pages of Patient  Logbook. Stick patch  monitor onto the last page of Patient Logbook.  Place Patient Logbook in the blue and white box. Use locking tab on box and tape box closed  securely. The blue and white box has prepaid postage on it. Please place it in the mailbox as  soon as possible. Your physician should have your test results approximately 7 days after the  monitor has been mailed back to [facility].  Call [facility] Customer Care at [phone] if you have questions regarding  your ZIO XT patch monitor. Call them immediately if you see an orange light blinking on your  monitor.  If your monitor falls off in less than 4 days, contact our Monitor department at [phone].  If your monitor becomes loose or falls off after 4 days call [employer] at [phone] for  suggestions on securing your monitor    Mindfulness-Based Stress Reduction: What to Know Mindfulness-based stress reduction (MBSR) is a mindfulness meditation program that normally takes place over 8 weeks. It usually includes weekly group classes and daily exercises to do at home. What are the benefits of MBSR? Mindfulness meditation therapies, like MBSR, can change  a person's brain and body in good ways, and make them healthier. MBSR can have many benefits, such as: Helping to lower stress hormones. Decreasing symptoms or helping to deal with symptoms of different conditions, like: Anxiety, which is feeling worried or nervous. Long-lasting pain. This is pain that lasts more than 3 months. Stress and worry. Trouble sleeping. Headaches, like migraines and tension headaches. Irritable bowel syndrome. Helping to handle stress from things you can't control, like: Long-term illnesses, especially if you have a lot of pain or other difficult symptoms. Big life events. Stress at work. Stress from taking care of someone else. Types of MBSR exercises Mindfulness. This is a common type of meditation. Meditation. It helps you focus your mind to feel  calm and happy. It has two main parts: paying attention and accepting. Paying attention means focusing on what is happening right now. This usually means noticing your breathing, your thoughts, how your body feels, and your emotions. Accepting means noticing these feelings and sensations without judging them. Instead of reacting to these thoughts or feelings, you just observe them and let them pass. MSBR exercises include: Body scanning. This is a mindfulness exercise where you pay attention to how different parts of your body feel. You can do this while lying down or sitting up. Sitting meditations. In this exercise, you focus on something like your breathing. When your mind starts to wander, gently bring it back to your breath. Keep doing this every time you notice your mind wandering. Mindful movements. This exercise involves moving and stretching your body slowly while paying attention to how it feels. Mindful Tasks. This means paying attention to how your body feels while doing things like walking or eating. Follow these instructions at home:  Find an in-person MBSR program or find a program that is online. Find a podcast or recording that provides guidance for MSBR exercises. Look for a therapist who knows how to use MBSR. Follow your treatment plan as told by your health care provider. This may include taking regular medicines and making changes to your diet or lifestyle. Where to find more information You can find more information about MBSR from: Your provider. Community-based meditation centers or programs. American Psychological Association at http://forbes-duran.com/. This information is not intended to replace advice given to you by your health care provider. Make sure you discuss any questions you have with your health care provider. Document Revised: 12/16/2023 Document Reviewed: 12/16/2023 Elsevier Patient Education  2025 [employer].

## 2024-08-02 ENCOUNTER — Other Ambulatory Visit: Payer: Self-pay | Admitting: *Deleted

## 2024-08-02 DIAGNOSIS — R748 Abnormal levels of other serum enzymes: Secondary | ICD-10-CM

## 2024-08-08 DIAGNOSIS — R000 Tachycardia, unspecified: Secondary | ICD-10-CM

## 2024-08-08 DIAGNOSIS — R0789 Other chest pain: Secondary | ICD-10-CM

## 2024-08-09 ENCOUNTER — Ambulatory Visit: Payer: Self-pay | Admitting: General Practice

## 2024-08-16 ENCOUNTER — Telehealth: Payer: Self-pay | Admitting: General Practice

## 2024-08-16 NOTE — Telephone Encounter (Signed)
 Patient states she was returning all. Please advise

## 2024-08-16 NOTE — Telephone Encounter (Signed)
 Pt not sure who/why called, no message left she just noticed missed call before 8am this morning.  After looking through chart pt aware I cannot find who called or why.  Aware someone will follow up if attempted to call and needs to speak with her.  Pt agreeable to plan.

## 2024-08-22 NOTE — Progress Notes (Unsigned)
 Patient: Allison Hayes Date of Birth: 2001-10-01  Reason for Visit: Follow up History from: Patient Primary Neurologist: Chima/Athar  Virtual Visit via Video Note  I connected with Allison Hayes on 08/23/24 at  2:00 PM EDT by a video enabled telemedicine application and verified that I am speaking with the correct person using two identifiers.  Location: Patient: at her home Provider: in the office    I discussed the limitations of evaluation and management by telemedicine and the availability of in person appointments. The patient expressed understanding and agreed to proceed.  ASSESSMENT AND PLAN 23 y.o. year old female   1.  Chronic migraine headache  - Doing great! 1 migraine every 1-2 weeks - Continue nortriptyline  25 mg at bedtime for migraine prevention - Continue Maxalt  10 mg as needed for acute migraine headache - Follow-up in 1 year via video visit with me, discussed okay for PCP to refill medications   HISTORY OF PRESENT ILLNESS: Today 08/22/24 08/23/24 SS: VV, doing very well, 1 every 1-2 weeks, Maxalt  works great. Remains on nortriptyline  25 mg at bedtime. No side effects. Sleeping well. In school at [employer], has 1 more year. 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 No aura with her migraines.   08/18/23 SS: Migraines have reduced, doing good on nortriptyline  25 mg at bedtime. 1 migraine a month. Will take ibuprofen or Maxalt  with excellent benefit within 30 minutes to 1 hour. No side effects from nortriptyline . She is not sure if she went to ENT.  Has not had any recurrent sinus infection.  She is at [facility].  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  08/12/22 Dr. Rush Brief HPI: 23 year old female with a history of asthma and iron deficiency anemia who follows in clinic for migraines.   At her last visit, nortriptyline  was increased to 25 mg QHS and Maxalt  was started for rescue.   Interval History: MRI brain 04/05/22 was  unremarkable. EEG performed for intermittent head twitching on 04/01/22 was normal.   Headaches have improved with increased dose of nortriptyline . She currently has 1 headache per week. Maxalt  works well for rescue and does not cause side effects. She is happy with her current level of headache control. Has not had any more episodes of head twitching.     Migraine days per month: 4 Headache free days per month: 26   Current Headache Regimen: Preventative: nortriptyline  25 mg QHS Abortive: Maxalt  10 mg PRN     Prior Therapies                                  Amitriptyline  25 mg QHS - lack of efficacy Nortriptyline  25 mg QHS Maxalt  10 mg PRN  REVIEW OF SYSTEMS: Out of a complete 14 system review of symptoms, the patient complains only of the following symptoms, and all other reviewed systems are negative.  See HPI  ALLERGIES: No Known Allergies  HOME MEDICATIONS: Outpatient Medications Prior to Visit  Medication Sig Dispense Refill   BLISOVI FE 1/20 1-20 MG-MCG tablet Take 1 tablet by mouth daily.     cetirizine  (ZYRTEC  ALLERGY) 10 MG tablet Take 1 tablet (10 mg total) by mouth daily. 30 tablet 0   fluticasone  (FLONASE ) 50 MCG/ACT nasal spray Place 2 sprays into both nostrils at bedtime.     ibuprofen (ADVIL) 200 MG tablet Take 200 mg by  mouth every 6 (six) hours as needed for moderate pain or headache.     nortriptyline  (PAMELOR ) 25 MG capsule Take 1 capsule (25 mg total) by mouth at bedtime. 90 capsule 3   Omega-3 Fatty Acids (FISH OIL) 1000 MG CAPS Take 1 capsule by mouth at bedtime.     pantoprazole  (PROTONIX ) 40 MG tablet TAKE 1 TABLET(40 MG) BY MOUTH DAILY (Patient taking differently: Take 40 mg by mouth at bedtime.) 30 tablet 11   rizatriptan  (MAXALT ) 10 MG tablet Take 1 tablet (10 mg total) by mouth as needed for migraine. May repeat in 2 hours if needed 10 tablet 11   XULANE 150-35 MCG/24HR transdermal patch Place 1 patch onto the skin once a week.     No  facility-administered medications prior to visit.    PAST MEDICAL HISTORY: Past Medical History:  Diagnosis Date   Asthma    GERD (gastroesophageal reflux disease)    Heart murmur    Migraines     PAST SURGICAL HISTORY: Past Surgical History:  Procedure Laterality Date   BIOPSY  10/05/2022   Procedure: BIOPSY;  Surgeon: Shaaron Lamar HERO, MD;  [facility];  Service: Endoscopy;;   ESOPHAGOGASTRODUODENOSCOPY (EGD) WITH PROPOFOL  N/A 10/05/2022   Procedure: ESOPHAGOGASTRODUODENOSCOPY (EGD) WITH PROPOFOL ;  Surgeon: Shaaron Lamar HERO, MD;  [facility];  Service: Endoscopy;  Laterality: N/A;  7:30AM, ASA 2   MALONEY DILATION N/A 10/05/2022   Procedure: MALONEY DILATION;  Surgeon: Shaaron Lamar HERO, MD;  [facility];  Service: Endoscopy;  Laterality: N/A;   TYMPANOSTOMY TUBE PLACEMENT      FAMILY HISTORY: Family History  Problem Relation Age of Onset   Migraines Mother    Allergic rhinitis Mother    Anxiety disorder Mother    Allergic rhinitis Father    Allergic rhinitis Sister    Allergic rhinitis Brother    Esophageal cancer Maternal Great-grandmother    Angioedema Neg Hx    Asthma Neg Hx    Atopy Neg Hx    Eczema Neg Hx    Urticaria Neg Hx    Immunodeficiency Neg Hx    Seizures Neg Hx    Autism Neg Hx    ADD / ADHD Neg Hx    Depression Neg Hx    Bipolar disorder Neg Hx    Schizophrenia Neg Hx    Colon cancer Neg Hx     SOCIAL HISTORY: Social History   Socioeconomic History   Marital status: Single    Spouse name: Not on file   Number of children: 0   Years of education: Not on file   Highest education level: Not on file  Occupational History   Not on file  Tobacco Use   Smoking status: Never   Smokeless tobacco: Never  Vaping Use   Vaping status: Never Used  Substance and Sexual Activity   Alcohol use: No   Drug use: No   Sexual activity: Not Currently  Other Topics Concern   Not on file  Social History Narrative   Lives  at home with mom, dad and brother. She is planning on attending [employer]. She enjoys public house manager, music and watching TV   Social Drivers of Corporate Investment Banker Strain: Not on file  Food Insecurity: Not on file  Transportation Needs: No Transportation Needs (07/19/2019)   Received from [facility]   PRAPARE - Transportation    Lack of Transportation (Medical): No    Lack of Transportation (  Non-Medical): No  Physical Activity: Not on file  Stress: No Stress Concern Present (07/19/2019)   Received from [facility] Occup[facility] - Occupational Stress Questionnaire    Feeling of Stress : Not at all  Social Connections: Unknown (12/23/2022)   Received from [facility]   Social Network    Social Network: Not on file  Intimate Partner Violence: Unknown (12/23/2022)   Received from [facility]   HITS    Physically Hurt: Not on file    Insult or Talk Down To: Not on file    Threaten Physical Harm: Not on file    Scream or Curse: Not on file    PHYSICAL EXAM  There were no vitals filed for this visit.  There is no height or weight on file to calculate BMI.  Virtual Visit   DIAGNOSTIC DATA (LABS, IMAGING, TESTING) - I reviewed patient records, labs, notes, testing and imaging myself where available.  Lab Results  Component Value Date   WBC 5.6 07/17/2024   HGB 14.0 07/17/2024   HCT 41.3 07/17/2024   MCV 89.2 07/17/2024   PLT 310 07/17/2024      Component Value Date/Time   NA 138 07/17/2024 1637   K 3.4 (L) 07/17/2024 1637   CL 105 07/17/2024 1637   CO2 19 (L) 07/17/2024 1637   GLUCOSE 111 (H) 07/17/2024 1637   BUN 7 07/17/2024 1637   CREATININE 0.59 07/17/2024 1637   CALCIUM 9.1 07/17/2024 1637   GFRNONAA >60 07/17/2024 1637   GFRAA NOT CALCULATED 08/16/2011 0533   No results found for: CHOL, HDL, LDLCALC, LDLDIRECT, TRIG, CHOLHDL No results found for: YHAJ8R No results found for: VITAMINB12 No  results found for: TSH  Lauraine Born, AGNP-C, DNP 08/22/2024, 4:26 PM [facility] [address] [place], [place] [address] [phone]

## 2024-08-23 ENCOUNTER — Telehealth: Payer: BC Managed Care – PPO | Admitting: Neurology

## 2024-08-23 DIAGNOSIS — G43709 Chronic migraine without aura, not intractable, without status migrainosus: Secondary | ICD-10-CM | POA: Diagnosis not present

## 2024-08-23 MED ORDER — NORTRIPTYLINE HCL 25 MG PO CAPS: 25.0000 mg | ORAL_CAPSULE | Freq: Every day | ORAL | 3 refills | Status: AC

## 2024-08-23 MED ORDER — RIZATRIPTAN BENZOATE 10 MG PO TABS: 10.0000 mg | ORAL_TABLET | ORAL | 11 refills | Status: AC | PRN

## 2024-08-23 NOTE — Patient Instructions (Signed)
 Great to see you today! Continue current medications Call for worsening symptoms Follow-up in 1 year virtually.  Thanks!!

## 2024-08-24 ENCOUNTER — Other Ambulatory Visit (HOSPITAL_BASED_OUTPATIENT_CLINIC_OR_DEPARTMENT_OTHER): Payer: Self-pay

## 2024-08-28 ENCOUNTER — Ambulatory Visit (HOSPITAL_COMMUNITY)
Admission: RE | Admit: 2024-08-28 | Discharge: 2024-08-28 | Disposition: A | Discharge status: Home / Self Care | Source: Ambulatory Visit | Attending: Cardiology | Admitting: Cardiology

## 2024-08-28 DIAGNOSIS — R0789 Other chest pain: Secondary | ICD-10-CM | POA: Diagnosis present

## 2024-08-28 LAB — ECHOCARDIOGRAM COMPLETE
AR max vel: 2.84 cm2
AV Area VTI: 2.75 cm2
AV Area mean vel: 2.72 cm2
AV Mean grad: 6 mmHg
AV Peak grad: 11.2 mmHg
Ao pk vel: 1.67 m/s
Area-P 1/2: 5.01 cm2
S' Lateral: 2.3 cm

## 2024-09-01 NOTE — Progress Notes (Unsigned)
 Cardiology Clinic Note   Patient Name: Allison Hayes Date of Encounter: 09/01/2024  Primary Care Provider:  Roni [facility] Primary Cardiologist:  None  Patient Profile    Allison Hayes 23 year old female presents to the clinic today for an evaluation of her chest discomfort.  Past Medical History    Past Medical History:  Diagnosis Date   Asthma    GERD (gastroesophageal reflux disease)    Heart murmur    Migraines    Past Surgical History:  Procedure Laterality Date   BIOPSY  10/05/2022   Procedure: BIOPSY;  Surgeon: Shaaron Lamar HERO, MD;  [facility];  Service: Endoscopy;;   ESOPHAGOGASTRODUODENOSCOPY (EGD) WITH PROPOFOL  N/A 10/05/2022   Procedure: ESOPHAGOGASTRODUODENOSCOPY (EGD) WITH PROPOFOL ;  Surgeon: Shaaron Lamar HERO, MD;  [facility];  Service: Endoscopy;  Laterality: N/A;  7:30AM, ASA 2   MALONEY DILATION N/A 10/05/2022   Procedure: MALONEY DILATION;  Surgeon: Shaaron Lamar HERO, MD;  [facility];  Service: Endoscopy;  Laterality: N/A;   TYMPANOSTOMY TUBE PLACEMENT      Allergies  No Known Allergies  History of Present Illness    Allison Hayes has a PMH of moderate persistent asthma, GERD, dysphagia, anxiety, iron deficiency anemia, hyperlipidemia, and chronic migraine.    She was seen and evaluated in the emergency department on 07/17/2024.  She reported left-sided chest pain that radiated to her back and left arm.  Pain appeared when she was driving to class.  She denied cough and trauma.  Her EKG showed sinus tachycardia 126 bpm with no ST or T wave abnormalities.  She was not noted to have lower extremity edema.  She received IV fluids and her heart rate improved to 97 bpm.  She noted that she had been evaluated by [facility] pediatric cardiology in 2020 for tachycardia.  She wore a cardiac monitor which showed no V. tach, SVT.  She was lost to follow-up.  Her cardiac troponins were negative.  She was able to ambulate  without difficulty.  Her chest pain resolved in the emergency department.  She denied caffeine intake or stimulants.  She denied recreational drug use.  She does not smoke.  She denied ever having symptoms like this previously.  She does take oral contraceptive therapy.  Her blood pressure was noted to be 156/93.  She was referred to cardiology for outpatient evaluation.   She presented to the clinic 07/21/24 for evaluation and stated she continued to notice symptoms.  She noted that 07/20/24 she had sharp pain that was on the left side of her chest that radiated down her arm and lasted for about 10 minutes.  She noted that she was sitting in class.  We reviewed her emergency department visit and her past cardiac history.  She expressed understanding.  She presented with her mom.  She noted that she occasionally had dizziness and palpitations with standing up.  This happened 3-4 times per week.  She noticed palpitations with chest pain 2 times per week.  Chest discomfort usually dissipates on its own with rest.  She reported staying well-hydrated.  She did report that she had some stress and anxiety.  We reviewed options for treatment and further prognostication.  I asked her to avoid triggers for palpitations, increase her physical activity (low intensity longer duration), increase hydration recommend about 100  ounces per day.  I  ordered 7-day cardiac event monitor and echocardiogram and planned follow-up in around 2  months.  I gave her the mindfulness stress reduction sheet.  She presents to the clinic today for follow-up evaluation and states***.  Today she denies  shortness of breath, lower extremity edema,melena, hematuria, and hemoptysis.     Home Medications    Prior to Admission medications   Medication Sig Start Date End Date Taking? Authorizing Provider  BLISOVI FE 1/20 1-20 MG-MCG tablet Take 1 tablet by mouth daily. 06/18/23   [provider]  cetirizine  (ZYRTEC  ALLERGY) 10 MG  tablet Take 1 tablet (10 mg total) by mouth daily. 08/18/20   Avegno, Komlanvi S, FNP  fluticasone  (FLONASE ) 50 MCG/ACT nasal spray Place 2 sprays into both nostrils at bedtime.    [provider]  ibuprofen (ADVIL) 200 MG tablet Take 200 mg by mouth every 6 (six) hours as needed for moderate pain or headache.    [provider]  nortriptyline  (PAMELOR ) 25 MG capsule Take 1 capsule (25 mg total) by mouth at bedtime. 08/18/23   Gayland Lauraine PARAS, NP  Omega-3 Fatty Acids (FISH OIL) 1000 MG CAPS Take 1 capsule by mouth at bedtime.    [provider]  pantoprazole  (PROTONIX ) 40 MG tablet TAKE 1 TABLET(40 MG) BY MOUTH DAILY Patient taking differently: Take 40 mg by mouth at bedtime. 07/16/23   Rudy Josette RAMAN, PA-C  rizatriptan  (MAXALT ) 10 MG tablet Take 1 tablet (10 mg total) by mouth as needed for migraine. May repeat in 2 hours if needed 08/18/23   Gayland Lauraine PARAS, NP  XULANE 150-35 MCG/24HR transdermal patch Place 1 patch onto the skin once a week. Patient not taking: Reported on 07/17/2024 07/17/24   [provider]    Family History    Family History  Problem Relation Age of Onset   Migraines Mother    Allergic rhinitis Mother    Anxiety disorder Mother    Allergic rhinitis Father    Allergic rhinitis Sister    Allergic rhinitis Brother    Esophageal cancer Maternal Great-grandmother    Angioedema Neg Hx    Asthma Neg Hx    Atopy Neg Hx    Eczema Neg Hx    Urticaria Neg Hx    Immunodeficiency Neg Hx    Seizures Neg Hx    Autism Neg Hx    ADD / ADHD Neg Hx    Depression Neg Hx    Bipolar disorder Neg Hx    Schizophrenia Neg Hx    Colon cancer Neg Hx    She indicated that her mother is alive. She indicated that her father is alive. She indicated that her sister is alive. She indicated that her brother is alive. She indicated that her maternal grandmother is alive. She indicated that her maternal grandfather is deceased. She indicated that her  paternal grandmother is alive. She indicated that her paternal grandfather is alive. She indicated that the status of her neg hx is unknown. She indicated that her maternal great-grandmother is deceased.  Social History    Social History   Socioeconomic History   Marital status: Single    Spouse name: Not on file   Number of children: 0   Years of education: Not on file   Highest education level: Not on file  Occupational History   Not on file  Tobacco Use   Smoking status: Never   Smokeless tobacco: Never  Vaping Use   Vaping status: Never Used  Substance and Sexual Activity   Alcohol use: No   Drug use: No  Sexual activity: Not Currently  Other Topics Concern   Not on file  Social History Narrative   Lives at home with mom, dad and brother. She is planning on attending [employer]. She enjoys public house manager, music and watching TV   Social Drivers of Corporate Investment Banker Strain: Not on file  Food Insecurity: Not on file  Transportation Needs: No Transportation Needs (07/19/2019)   Received from [facility]   PRAPARE - Transportation    Lack of Transportation (Medical): No    Lack of Transportation (Non-Medical): No  Physical Activity: Not on file  Stress: No Stress Concern Present (07/19/2019)   Received from [facility] Occupational [facility] - Occupational Stress Questionnaire    Feeling of Stress : Not at all  Social Connections: Unknown (12/23/2022)   Received from [facility]   Social Network    Social Network: Not on file  Intimate Partner Violence: Unknown (12/23/2022)   Received from [facility]   HITS    Physically Hurt: Not on file    Insult or Talk Down To: Not on file    Threaten Physical Harm: Not on file    Scream or Curse: Not on file     Review of Systems    General:  No chills, fever, night sweats or weight changes.  Cardiovascular:  No chest pain, dyspnea on exertion, edema, orthopnea, palpitations,  paroxysmal nocturnal dyspnea. Dermatological: No rash, lesions/masses Respiratory: No cough, dyspnea Urologic: No hematuria, dysuria Abdominal:   No nausea, vomiting, diarrhea, bright red blood per rectum, melena, or hematemesis Neurologic:  No visual changes, wkns, changes in mental status. All other systems reviewed and are otherwise negative except as noted above.  Physical Exam    VS:  There were no vitals taken for this visit. , BMI There is no height or weight on file to calculate BMI. GEN: Well nourished, well developed, in no acute distress. HEENT: normal. Neck: Supple, no JVD, carotid bruits, or masses. Cardiac: RRR, no murmurs, rubs, or gallops. No clubbing, cyanosis, edema.  Radials/DP/PT 2+ and equal bilaterally.  Respiratory:  Respirations regular and unlabored, clear to auscultation bilaterally. GI: Soft, nontender, nondistended, BS + x 4. MS: no deformity or atrophy. Skin: warm and dry, no rash. Neuro:  Strength and sensation are intact. Psych: Normal affect.  Accessory Clinical Findings    Recent Labs: 07/17/2024: BUN 7; Creatinine, Ser 0.59; Hemoglobin 14.0; Platelets 310; Potassium 3.4; Sodium 138   Recent Lipid Panel No results found for: CHOL, TRIG, HDL, CHOLHDL, VLDL, LDLCALC, LDLDIRECT  No BP recorded.  {Refresh Note OR Click here to enter BP  :1}***    ECG personally reviewed by me today-none today.    EKG 07/18/2024  Sinus tachycardia 126 bpm  Cardiac event monitor 07/21/2024  Patch Wear Time:  11 days and 5 hours (2025-09-29T14:56:16-0400 to 2025-10-10T20:18:49-0400)   Patient had a min HR of 48 bpm, max HR of 189 bpm, and avg HR of 90 bpm. Predominant underlying rhythm was Sinus Rhythm. Isolated SVEs were rare (<1.0%), SVE Couplets were rare (<1.0%), and no SVE Triplets were present. Isolated VEs were rare (<1.0%), and no VE Couplets or VE Triplets were present.    Symptoms associated with sinus tachycardia.     Conclusion:  Normal/unremarkable study   Echocardiogram 08/28/2024   IMPRESSIONS     1. Left ventricular ejection fraction, by estimation, is 65 to 70%. Left  ventricular ejection fraction by 3D volume is 66 %.  The left ventricle has  normal function. The left ventricle has no regional wall motion  abnormalities. Left ventricular diastolic   parameters were normal.   2. Right ventricular systolic function is normal. The right ventricular  size is normal.   3. The mitral valve is normal in structure. Trivial mitral valve  regurgitation. No evidence of mitral stenosis.   4. The aortic valve is normal in structure. Aortic valve regurgitation is  not visualized. No aortic stenosis is present.   5. The inferior vena cava is normal in size with greater than 50%  respiratory variability, suggesting right atrial pressure of 3 mmHg.   FINDINGS   Left Ventricle: Left ventricular ejection fraction, by estimation, is 65  to 70%. Left ventricular ejection fraction by 3D volume is 66 %. The left  ventricle has normal function. The left ventricle has no regional wall  motion abnormalities. The left  ventricular internal cavity size was normal in size. There is no left  ventricular hypertrophy. Left ventricular diastolic parameters were  normal.   Right Ventricle: The right ventricular size is normal. No increase in  right ventricular wall thickness. Right ventricular systolic function is  normal.   Left Atrium: Left atrial size was normal in size.   Right Atrium: Right atrial size was normal in size.   Pericardium: There is no evidence of pericardial effusion.   Mitral Valve: The mitral valve is normal in structure. Trivial mitral  valve regurgitation. No evidence of mitral valve stenosis.   Tricuspid Valve: The tricuspid valve is normal in structure. Tricuspid  valve regurgitation is trivial. No evidence of tricuspid stenosis.   Aortic Valve: The aortic valve is normal in structure. Aortic valve   regurgitation is not visualized. No aortic stenosis is present. Aortic  valve mean gradient measures 6.0 mmHg. Aortic valve peak gradient measures  11.2 mmHg. Aortic valve area, by VTI  measures 2.75 cm.   Pulmonic Valve: The pulmonic valve was normal in structure. Pulmonic valve  regurgitation is not visualized. No evidence of pulmonic stenosis.   Aorta: The aortic root and ascending aorta are structurally normal, with  no evidence of dilitation.   Venous: The inferior vena cava is normal in size with greater than 50%  respiratory variability, suggesting right atrial pressure of 3 mmHg.   IAS/Shunts: No atrial level shunt detected by color flow Doppler.    Assessment & Plan   1.  Chest discomfort-denies chest discomfort/pain today.  Previously noted sharp chest discomfort that would be present on her left side and radiated down her arm.  It would last for about 10 minutes.  Echocardiogram reassuring.  Details above.  Pain appears to be related to precordial type pain. Patient reassured  Sinus tachycardia-heart rate today 10***6 bpm.  Previously seen and evaluated by [facility] cardiology for tachycardia in 2020.  Cardiac event monitor reassuring.  Details above. Maintain p.o. hydration Avoid triggers caffeine, chocolate, EtOH, dehydration etc. Mindfulness stress reduction sheet-continue Increase physical activity-goal 150 minutes of moderate physical activity per week   Disposition: Follow-up with Dr. Sheena or me as needed   Josefa HERO. Kijuana Ruppel NP-C     09/01/2024, 7:36 AM [facility] [address] [place], [place] [address] Office [phone]    Notice: This dictation was prepared with Dragon dictation along with smaller phrase technology. Any transcriptional errors that result from this process are unintentional and may not be corrected upon review.   I spent 14*** minutes examining this patient,  reviewing medications, and using patient  centered shared decision making involving their cardiac care.   I spent  20 minutes reviewing past medical history,  medications, and prior cardiac tests.

## 2024-09-04 ENCOUNTER — Ambulatory Visit: Attending: General Practice | Admitting: General Practice

## 2024-09-04 ENCOUNTER — Encounter: Payer: Self-pay | Admitting: General Practice

## 2024-09-04 VITALS — BP 132/84 | HR 102 | Ht 63.0 in | Wt 170.0 lb

## 2024-09-04 DIAGNOSIS — R000 Tachycardia, unspecified: Secondary | ICD-10-CM

## 2024-09-04 DIAGNOSIS — R0789 Other chest pain: Secondary | ICD-10-CM | POA: Diagnosis not present

## 2024-09-04 NOTE — Patient Instructions (Addendum)
 Medication Instructions:  Your physician recommends that you continue on your current medications as directed. Please refer to the Current Medication list given to you today. *If you need a refill on your cardiac medications before your next appointment, please call your pharmacy*  Lab Work: TODAY-TSH, FREE T3 AND T4 If you have labs (blood work) drawn today and your tests are completely normal, you will receive your results only by: MyChart Message (if you have MyChart) OR A paper copy in the mail If you have any lab test that is abnormal or we need to change your treatment, we will call you to review the results.  Testing/Procedures: None Ordered  Follow-Up: At [facility], you and your health needs are our priority.  As part of our continuing mission to provide you with exceptional heart care, our providers are all part of one team.  This team includes your primary Cardiologist (physician) and Advanced Practice Providers or APPs (Physician Assistants and Nurse Practitioners) who all work together to provide you with the care you need, when you need it.  Your next appointment:   6 month(s)  Provider:   Kardie Tobb, MD or Josefa Beauvais, NP     We recommend signing up for the patient portal called MyChart.  Sign up information is provided on this After Visit Summary.  MyChart is used to connect with patients for Virtual Visits (Telemedicine).  Patients are able to view lab/test results, encounter notes, upcoming appointments, etc.  Non-urgent messages can be sent to your provider as well.   To learn more about what you can do with MyChart, go to [email or web address].   Other Instructions Increase low intensity exercise as tolerated

## 2024-09-09 LAB — HEPATIC FUNCTION PANEL
ALT: 12 IU/L (ref 0–32)
AST: 18 IU/L (ref 0–40)
Albumin: 4.9 g/dL (ref 4.0–5.0)
Alkaline Phosphatase: 166 IU/L — ABNORMAL HIGH (ref 41–116)
Bilirubin Total: 1.4 mg/dL — ABNORMAL HIGH (ref 0.0–1.2)
Bilirubin, Direct: 0.34 mg/dL (ref 0.00–0.40)
Total Protein: 8.1 g/dL (ref 6.0–8.5)

## 2024-09-09 LAB — TSH+T4F+T3FREE
Free T4: 1.37 ng/dL (ref 0.82–1.77)
T3, Free: 3.6 pg/mL (ref 2.0–4.4)
TSH: 0.51 u[IU]/mL (ref 0.450–4.500)

## 2024-09-09 LAB — GAMMA GT: GGT: 11 IU/L (ref 0–60)

## 2024-09-10 ENCOUNTER — Ambulatory Visit: Payer: Self-pay | Admitting: Gastroenterology

## 2024-09-11 ENCOUNTER — Ambulatory Visit: Payer: Self-pay | Admitting: General Practice

## 2024-09-12 ENCOUNTER — Other Ambulatory Visit: Payer: Self-pay | Admitting: *Deleted

## 2024-09-12 DIAGNOSIS — R748 Abnormal levels of other serum enzymes: Secondary | ICD-10-CM

## 2024-10-01 ENCOUNTER — Ambulatory Visit: Payer: Self-pay | Admitting: Gastroenterology

## 2024-10-01 NOTE — Progress Notes (Unsigned)
 GI Office Note    Referring Provider: Roni, [facility] Primary Care Physician:  Roni, [facility] Primary Gastroenterologist: Lamar HERO.Rourk, MD  Date:  10/02/2024  ID:  Allison Hayes, DOB August 28, 2001, MRN [id number]   Chief Complaint   Chief Complaint  Patient presents with   Follow-up    Follow up. Need to go over labs. Having a pain in right mid quad area.    History of Present Illness  Allison Hayes is a 23 y.o. female with a history of *** presenting today with complaint of   OV May 2025: miralax and colace reported as ineffective. Linzess 145 mcg samples provided to her.   Labs with PCP on 05/05/2024 showed mild alk phos elevation of 131. Other LFTs within normal limits. No prior alk phos elevation. LDL also elevated.   Last office visit 06/05/2024 with Josette Centers, PA-C.  Had taken samples of Linzess 145 mcg which did work but stated after that her bowels have been moving normally she thinks is because she had been eating healthier and has started new diet week prior.  She had reported some recent blood work with her primary care showed elevated alkaline phosphatase.  Her labs were reviewed with her in the office.  She denied alcohol use or herbal supplementation.  GERD remains well-controlled. Constipation noted to be improved with dietary changes and GERD controlled on pantoprazole . In regards to her elevated alk phos she was recommended to have labs rechecked in 8 weeks  (HFP and GGT) and given fatty liver diet recommendations.   Patient's mother later reached back out to notify clinic that the patient's great uncle has stomach and bone cancer but her maternal grandfather passed from liver cancer at age 71. Mother has fatty liver.   Given Josette Centers PA-C is no longer within our clinic I reviewed her recall labs which showed a slightly elevated bilirubin (1.4) and ongoing elevation of alkaline phosphatase (166) with normal GGT (11). Normal thyroid   function.   Given ongoing elevation in alk phos I ordered additional serologies which revealed: ASMA (-) ANA + (1:80  speckled pattern) AMA + (26.1) PBC panel with + ANA, AMA M2 59 (H), ASMA 1:20, GP-210 and SP 100 pending.  Today:     Wt Readings from Last 6 Encounters:  10/02/24 165 lb (74.8 kg)  09/04/24 170 lb (77.1 kg)  07/21/24 175 lb (79.4 kg)  07/17/24 175 lb (79.4 kg)  06/05/24 175 lb 3.2 oz (79.5 kg)  02/24/24 170 lb 6.4 oz (77.3 kg)    Body mass index is 29.23 kg/m.   Current Outpatient Medications  Medication Sig Dispense Refill   albuterol  (VENTOLIN  HFA) 108 (90 Base) MCG/ACT inhaler SMARTSIG:2 Puff(s) By Mouth Every 4-6 Hours PRN     cetirizine  (ZYRTEC  ALLERGY) 10 MG tablet Take 1 tablet (10 mg total) by mouth daily. 30 tablet 0   fluticasone  (FLONASE ) 50 MCG/ACT nasal spray Place 2 sprays into both nostrils at bedtime.     hydrOXYzine (ATARAX) 25 MG tablet Take 25 mg by mouth 2 (two) times daily. (Patient taking differently: Take 25 mg by mouth 2 (two) times daily. PRN)     nortriptyline  (PAMELOR ) 25 MG capsule Take 1 capsule (25 mg total) by mouth at bedtime. 90 capsule 3   Omega-3 Fatty Acids (FISH OIL) 1000 MG CAPS Take 1 capsule by mouth at bedtime.     pantoprazole  (PROTONIX ) 40 MG tablet TAKE 1 TABLET(40 MG) BY MOUTH DAILY (Patient taking  differently: PRN) 30 tablet 11   propranolol (INDERAL) 10 MG tablet Take 10 mg by mouth 2 (two) times daily. (Patient taking differently: Take 10 mg by mouth 2 (two) times daily. PRN)     rizatriptan  (MAXALT ) 10 MG tablet Take 1 tablet (10 mg total) by mouth as needed for migraine. May repeat in 2 hours if needed 10 tablet 11   sertraline (ZOLOFT) 50 MG tablet Take 50 mg by mouth daily.     XULANE 150-35 MCG/24HR transdermal patch Place 1 patch onto the skin once a week.     No current facility-administered medications for this visit.    Past Medical History:  Diagnosis Date   Asthma    GERD (gastroesophageal reflux  disease)    Heart murmur    Migraines     Past Surgical History:  Procedure Laterality Date   BIOPSY  10/05/2022   Procedure: BIOPSY;  Surgeon: Shaaron Lamar HERO, MD;  [facility];  Service: Endoscopy;;   ESOPHAGOGASTRODUODENOSCOPY (EGD) WITH PROPOFOL  N/A 10/05/2022   Procedure: ESOPHAGOGASTRODUODENOSCOPY (EGD) WITH PROPOFOL ;  Surgeon: Shaaron Lamar HERO, MD;  [facility];  Service: Endoscopy;  Laterality: N/A;  7:30AM, ASA 2   MALONEY DILATION N/A 10/05/2022   Procedure: MALONEY DILATION;  Surgeon: Shaaron Lamar HERO, MD;  [facility];  Service: Endoscopy;  Laterality: N/A;   TYMPANOSTOMY TUBE PLACEMENT      Family History  Problem Relation Age of Onset   Migraines Mother    Allergic rhinitis Mother    Anxiety disorder Mother    Allergic rhinitis Father    Allergic rhinitis Sister    Allergic rhinitis Brother    Esophageal cancer Maternal Great-grandmother    Angioedema Neg Hx    Asthma Neg Hx    Atopy Neg Hx    Eczema Neg Hx    Urticaria Neg Hx    Immunodeficiency Neg Hx    Seizures Neg Hx    Autism Neg Hx    ADD / ADHD Neg Hx    Depression Neg Hx    Bipolar disorder Neg Hx    Schizophrenia Neg Hx    Colon cancer Neg Hx     Allergies as of 10/02/2024   (No Known Allergies)    Social History   Socioeconomic History   Marital status: Single    Spouse name: Not on file   Number of children: 0   Years of education: Not on file   Highest education level: Not on file  Occupational History   Not on file  Tobacco Use   Smoking status: Never   Smokeless tobacco: Never  Vaping Use   Vaping status: Never Used  Substance and Sexual Activity   Alcohol use: No   Drug use: No   Sexual activity: Not Currently  Other Topics Concern   Not on file  Social History Narrative   Lives at home with mom, dad and brother. She is planning on attending [employer]. She enjoys public house manager, music and watching TV   Social Drivers of Manufacturing Engineer Strain: Not on file  Food Insecurity: Not on file  Transportation Needs: No Transportation Needs (07/19/2019)   Received from [facility]   PRAPARE - Transportation    Lack of Transportation (Medical): No    Lack of Transportation (Non-Medical): No  Physical Activity: Not on file  Stress: No Stress Concern Present (07/19/2019)   Received from [facility]  of Occupational Health - Occupational Stress Questionnaire    Feeling of Stress : Not at all  Social Connections: Not on file    Review of Systems   Gen: Denies fever, chills, anorexia. Denies fatigue, weakness, weight loss.  CV: Denies chest pain, palpitations, syncope, peripheral edema, and claudication. Resp: Denies dyspnea at rest, cough, wheezing, coughing up blood, and pleurisy. GI: See HPI Derm: Denies rash, itching, dry skin Psych: Denies depression, anxiety, memory loss, confusion. No homicidal or suicidal ideation.  Heme: Denies bruising, bleeding, and enlarged lymph nodes.  Physical Exam   Ht 5' 3 (1.6 m)   Wt 165 lb (74.8 kg)   BMI 29.23 kg/m   General:   Alert and oriented. No distress noted. Pleasant and cooperative.  Head:  Normocephalic and atraumatic. Eyes:  Conjuctiva clear without scleral icterus. Mouth:  Oral mucosa pink and moist. Good dentition. No lesions. Lungs:  Clear to auscultation bilaterally. No wheezes, rales, or rhonchi. No distress.  Heart:  S1, S2 present without murmurs appreciated.  Abdomen:  +BS, soft, non-tender and non-distended. No rebound or guarding. No HSM or masses noted. Rectal: Msk:  Symmetrical without gross deformities. Normal posture. Extremities:  Without edema. Neurologic:  Alert and  oriented x4 Psych:  Alert and cooperative. Normal mood and affect.  Assessment & Plan  INDONESIA MCKEOUGH is a 23 y.o. female presenting today to discuss her elevated alkaline phosphatase and recent labwork.    Start with US  and if no  convincing evidence of cholestasis or obstruction of bile ducts then may need to consider liver biopsy given positive autoimmune markers even though she meets criteria for starting urso and diagnosis of PBC given serologic evidence of cholestasis (elevated AP) and positive AMA.    Follow up   Follow up ***  Charmaine Melia, MSN, FNP-BC, AGACNP-BC [facility]

## 2024-10-02 ENCOUNTER — Encounter: Payer: Self-pay | Admitting: *Deleted

## 2024-10-02 ENCOUNTER — Telehealth: Payer: Self-pay | Admitting: Gastroenterology

## 2024-10-02 ENCOUNTER — Telehealth: Admitting: Gastroenterology

## 2024-10-02 ENCOUNTER — Encounter: Payer: Self-pay | Admitting: Gastroenterology

## 2024-10-02 VITALS — Ht 63.0 in | Wt 165.0 lb

## 2024-10-02 DIAGNOSIS — K743 Primary biliary cirrhosis: Secondary | ICD-10-CM

## 2024-10-02 DIAGNOSIS — K921 Melena: Secondary | ICD-10-CM

## 2024-10-02 DIAGNOSIS — R748 Abnormal levels of other serum enzymes: Secondary | ICD-10-CM

## 2024-10-02 DIAGNOSIS — K219 Gastro-esophageal reflux disease without esophagitis: Secondary | ICD-10-CM

## 2024-10-02 NOTE — Telephone Encounter (Signed)
 Noted. Left the kit at the front desk for the pt.

## 2024-10-02 NOTE — Patient Instructions (Addendum)
 We are getting scheduled for an ultrasound of your liver and gallbladder in the near future to further evaluate your right upper quadrant pain and your elevated alkaline phosphatase.  I am seeing you more information about primary biliary cholangitis (PBC) and a Mediterranean diet which is good for liver health.  After we get your ultrasound we will determine when we need to follow-up with labs and what next apps will be.  I will go and tentatively set you up for an office visit in 3 months but we will certainly adjust that as needed.  As we discussed if you want to look at more information online regarding PBC I would recommend you follow the American liver foundation or look up PBC on the [employer] of gastroenterology site for patients.  It was a pleasure to see you today. I want to create trusting relationships with patients. If you receive a survey regarding your visit,  I greatly appreciate you taking time to fill this out on paper or through your MyChart. I value your feedback.  Charmaine Melia, MSN, FNP-BC, AGACNP-BC [facility]

## 2024-10-02 NOTE — Telephone Encounter (Signed)
 Patient reported some melena.  Office visit today therefore I suggested us  to perform FOBT.  I would like her to complete 3 of these to assess for the presence of blood in her stool.  She is aware to come by the office at her soonest availability given the increment weather today and tomorrow to pick up the supplies that are needed.  Charmaine Melia, MSN, APRN, FNP-BC, AGACNP-BC [facility]

## 2024-10-02 NOTE — Progress Notes (Signed)
 Primary Care Physician:  Roni, [facility]  Primary Gastroenterologist: Lamar HERO. Rourk, MD  Patient Location: Home Reason for Visit: discuss lab work and some new complaints.  Provider Location: [facility]  Persons present on the virtual encounter, with roles: Patient - Allison Hayes; Provider - Charmaine Melia, NP   Total time (minutes) spent on medical discussion: 17 minutes and 30  seconds  Virtual Visit Encounter Note Visit is conducted virtually and was requested by patient.   I connected with Allison Hayes on 10/02/24 at 11:00 AM EST by video and verified that I am speaking with the correct person using two identifiers.   I discussed the limitations, risks, security and privacy concerns of performing an evaluation and management service by video and the availability of in person appointments. I also discussed with the patient that there may be a patient responsible charge related to this service. The patient expressed understanding and agreed to proceed.  Chief Complaint  Patient presents with   Follow-up    Follow up. Need to go over labs. Having a pain in right mid quad area.     History of Present Illness: Allison Hayes is a 23 y.o. female with a history of GERD, asthma, migraines, and previously elevated alkaline phosphatase who was originally scheduled to come in person today but given increment weather visit was made virtually to discuss her recent lab workup in regards to her elevated alkaline phosphatase and discuss some new concerns including melena as well as some right upper quadrant pain.  OV May 2025: miralax and colace reported as ineffective. Linzess 145 mcg samples provided to her.    Labs with PCP on 05/05/2024 showed mild alk phos elevation of 131. Other LFTs within normal limits. No prior alk phos elevation. LDL also elevated.    Last office visit 06/05/2024 with Josette Centers, PA-C.  Had taken samples of Linzess 145  mcg which did work but stated after that her bowels have been moving normally she thinks is because she had been eating healthier and has started new diet week prior.  She had reported some recent blood work with her primary care showed elevated alkaline phosphatase.  Her labs were reviewed with her in the office.  She denied alcohol use or herbal supplementation.  GERD remains well-controlled. Constipation noted to be improved with dietary changes and GERD controlled on pantoprazole . In regards to her elevated alk phos she was recommended to have labs rechecked in 8 weeks  (HFP and GGT) and given fatty liver diet recommendations.    Patient's mother later reached back out to notify clinic that the patient's great uncle has stomach and bone cancer but her maternal grandfather passed from liver cancer at age 22. Mother has fatty liver.    Given Josette Centers PA-C is no longer within our clinic I reviewed her recall labs which showed a slightly elevated bilirubin (1.4) and ongoing elevation of alkaline phosphatase (166) with normal GGT (11). Normal thyroid  function.    Given ongoing elevation in alk phos I ordered additional serologies which revealed: ASMA (-) ANA + (1:80  speckled pattern) AMA + (26.1) PBC panel with + ANA, AMA M2 59 (H), ASMA 1:20, GP-210 and SP 100 pending.   Today:  She has been experiencing intermittent right shoulder pain for the past few months, which worsens at night and is described as a deep aching feeling, similar to a 'really bad toothache'. The pain is sometimes associated with  her posture, such as bending over.  She has had prior lab results showing elevated alkaline phosphatase and bilirubin. Recent lab results show a slight elevation in bilirubin and a 30-point increase in alkaline phosphatase, while GGT levels remain normal. Autoimmune markers, including ANA and AMA, are positive. She has not yet undergone an ultrasound.  She reports having black stools over the  past few days. No recent use of Pepto-Bismol, kaopectate, iron supplements, ibuprofen, Advil, Aleve, meloxicam , BC powders, or recent alcohol intake. She also denies consuming a diet high in greens such as spinach, kale, turnip greens, or collard greens.  She is currently taking pantoprazole  as needed and reports no recent increase in its use. GERD fairly well controlled.   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      Medications Current Meds  Medication Sig   albuterol  (VENTOLIN  HFA) 108 (90 Base) MCG/ACT inhaler SMARTSIG:2 Puff(s) By Mouth Every 4-6 Hours PRN   cetirizine  (ZYRTEC  ALLERGY) 10 MG tablet Take 1 tablet (10 mg total) by mouth daily.   fluticasone  (FLONASE ) 50 MCG/ACT nasal spray Place 2 sprays into both nostrils at bedtime.   hydrOXYzine (ATARAX) 25 MG tablet Take 25 mg by mouth 2 (two) times daily. (Patient taking differently: Take 25 mg by mouth 2 (two) times daily. PRN)   nortriptyline  (PAMELOR ) 25 MG capsule Take 1 capsule (25 mg total) by mouth at bedtime.   Omega-3 Fatty Acids (FISH OIL) 1000 MG CAPS Take 1 capsule by mouth at bedtime.   pantoprazole  (PROTONIX ) 40 MG tablet TAKE 1 TABLET(40 MG) BY MOUTH DAILY (Patient taking differently: PRN)   propranolol (INDERAL) 10 MG tablet Take 10 mg by mouth 2 (two) times daily. (Patient taking differently: Take 10 mg by mouth 2 (two) times daily. PRN)   rizatriptan  (MAXALT ) 10 MG tablet Take 1 tablet (10 mg total) by mouth as needed for migraine. May repeat in 2 hours if needed   sertraline (ZOLOFT) 50 MG tablet Take 50 mg by mouth daily.   XULANE 150-35 MCG/24HR transdermal patch Place 1 patch onto the skin once a week.     History Past Medical History:  Diagnosis Date   Asthma    GERD (gastroesophageal reflux disease)    Heart murmur    Migraines     Past Surgical History:  Procedure Laterality Date   BIOPSY  10/05/2022   Procedure: BIOPSY;  Surgeon: Shaaron Lamar HERO, MD;  [facility];   Service: Endoscopy;;   ESOPHAGOGASTRODUODENOSCOPY (EGD) WITH PROPOFOL  N/A 10/05/2022   Procedure: ESOPHAGOGASTRODUODENOSCOPY (EGD) WITH PROPOFOL ;  Surgeon: Shaaron Lamar HERO, MD;  [facility];  Service: Endoscopy;  Laterality: N/A;  7:30AM, ASA 2   MALONEY DILATION N/A 10/05/2022   Procedure: MALONEY DILATION;  Surgeon: Shaaron Lamar HERO, MD;  [facility];  Service: Endoscopy;  Laterality: N/A;   TYMPANOSTOMY TUBE PLACEMENT      Family History  Problem Relation Age of Onset   Migraines Mother    Allergic rhinitis Mother    Anxiety disorder Mother    Allergic rhinitis Father    Allergic rhinitis Sister    Allergic rhinitis Brother    Esophageal cancer Maternal Great-grandmother    Angioedema Neg Hx    Asthma Neg Hx    Atopy Neg Hx    Eczema Neg Hx    Urticaria Neg Hx    Immunodeficiency Neg Hx    Seizures Neg Hx    Autism Neg Hx  ADD / ADHD Neg Hx    Depression Neg Hx    Bipolar disorder Neg Hx    Schizophrenia Neg Hx    Colon cancer Neg Hx     Social History   Socioeconomic History   Marital status: Single    Spouse name: Not on file   Number of children: 0   Years of education: Not on file   Highest education level: Not on file  Occupational History   Not on file  Tobacco Use   Smoking status: Never   Smokeless tobacco: Never  Vaping Use   Vaping status: Never Used  Substance and Sexual Activity   Alcohol use: No   Drug use: No   Sexual activity: Not Currently  Other Topics Concern   Not on file  Social History Narrative   Lives at home with mom, dad and brother. She is planning on attending [employer]. She enjoys public house manager, music and watching TV   Social Drivers of Corporate Investment Banker Strain: Not on file  Food Insecurity: Not on file  Transportation Needs: No Transportation Needs (07/19/2019)   Received from [facility]   PRAPARE - Transportation    Lack of Transportation (Medical): No    Lack of Transportation  (Non-Medical): No  Physical Activity: Not on file  Stress: No Stress Concern Present (07/19/2019)   Received from [facility] of Occupational Health - Occupational Stress Questionnaire    Feeling of Stress : Not at all  Social Connections: Not on file     Review of Systems: Gen: Denies fever, chills, anorexia. Denies fatigue, weakness, weight loss.  CV: Denies chest pain, palpitations, syncope, peripheral edema, and claudication. Resp: Denies dyspnea at rest, cough, wheezing, coughing up blood, and pleurisy. GI: see HPI Derm: Denies rash, itching, dry skin Psych: Denies depression, anxiety, memory loss, confusion. No homicidal or suicidal ideation.  Heme: Denies bruising, bleeding, and enlarged lymph nodes.  Observations/Objective: No distress. Alert and oriented. Pleasant. Well nourished. Normal mood and affect. Unable to perform complete physical exam due to video encounter.   Assessment/Plan    Evaluation of elevated alkaline phosphatase, Primary biliary cholangitis Elevated alkaline phosphatase with differential including PBC, autoimmune hepatitis, and gallstones. Suspected primary biliary cholangitis (PBC) due to elevated alkaline phosphatase and positive ANA as well as AMA. ANA is positive, which can be associated with other autoimmune conditions as well. Differential includes autoimmune hepatitis and primary sclerosing cholangitis (PSC) (although less likely). PBC is more common in women aged 97-45, but diagnosis at a younger age is possible. Early treatment with ursodiol  can prevent progression to cirrhosis and end-stage liver disease. Ultrasound is prioritized to rule out gallstones or sludge before initiating treatment. If ultrasound is unremarkable, ursodiol  will be considered to manage likely PBC. - Ordered RUQ US  to evaluate for choledocholithiasis or choledocholithiasis.  - If ultrasound is unremarkable, will initiate ursodiol  to manage PBC. - Provided  educational materials on PBC and Mediterranean diet via MyChart.  Melena Reports of black stools over the past few days. No recent intake of medications or foods that could cause black stools. Stool test for occult blood is planned to assess for blood presence. - Provided stool cards for occult blood testing. - If occult blood is positive, will consider upper endoscopy to evaluate for gastrointestinal bleeding.  GERD Well controlled with pantoprazole  40 mg daily as needed.      Follow Up Instructions:  3 months, sooner pending US  findings and  need to discuss symptoms further.   I discussed the assessment and treatment plan with the patient. The patient was provided an opportunity to ask questions and all were answered. The patient agreed with the plan and demonstrated an understanding of the instructions.   The patient was advised to call back or seek an in-person evaluation if the symptoms worsen or if the condition fails to improve as anticipated.    Charmaine Melia, MSN, APRN, FNP-BC, AGACNP-BC [facility]

## 2024-10-04 LAB — PRIMARY BILIARY CHOLANGITIS PR
Anti-GP-210 Ab (RDL): <20 U (ref <20)
Anti-Mitochondrial Ab by IFA: <1:20 {titer}
Anti-Mitochondrial M2 Ab (RDL): 59 U — ABNORMAL HIGH (ref <20)
Anti-Nuclear Ab by IFA (RDL): POSITIVE — AB
Anti-SP-100 Ab (RDL): <20 U (ref <20)
Anti-Smooth Muscle Ab by IFA: 1:20 {titer} — AB

## 2024-10-04 LAB — ANA TITER AND PATTERN: Speckled Pattern: 1:80 {titer} — ABNORMAL HIGH

## 2024-10-04 LAB — ANTI-SMOOTH MUSCLE ANTIBODY, IGG: Smooth Muscle Ab: 8 U (ref 0–19)

## 2024-10-04 LAB — ANA W/REFLEX: Anti Nuclear Antibody (ANA): NEGATIVE

## 2024-10-04 LAB — IGG, IGA, IGM
IgG (Immunoglobin G), Serum: 1222 mg/dL (ref 586–1602)
IgM (Immunoglobulin M), Srm: 131 mg/dL (ref 26–217)
Immunoglobulin A, (IgA) QN, Serum: 264 mg/dL (ref 87–352)

## 2024-10-04 LAB — MITOCHONDRIAL ANTIBODIES: Mitochondrial Ab: 26.1 U — ABNORMAL HIGH (ref 0.0–20.0)

## 2024-10-05 ENCOUNTER — Ambulatory Visit (HOSPITAL_COMMUNITY)
Admission: RE | Admit: 2024-10-05 | Discharge: 2024-10-05 | Disposition: A | Discharge status: Home / Self Care | Source: Ambulatory Visit | Attending: Gastroenterology | Admitting: Gastroenterology

## 2024-10-05 DIAGNOSIS — K743 Primary biliary cirrhosis: Secondary | ICD-10-CM | POA: Insufficient documentation

## 2024-10-05 DIAGNOSIS — R748 Abnormal levels of other serum enzymes: Secondary | ICD-10-CM | POA: Insufficient documentation

## 2024-10-08 ENCOUNTER — Ambulatory Visit: Payer: Self-pay | Admitting: Gastroenterology

## 2024-10-08 ENCOUNTER — Other Ambulatory Visit: Payer: Self-pay | Admitting: Gastroenterology

## 2024-10-08 DIAGNOSIS — K743 Primary biliary cirrhosis: Secondary | ICD-10-CM

## 2024-10-08 MED ORDER — URSODIOL 500 MG PO TABS: 500.0000 mg | ORAL_TABLET | Freq: Two times a day (BID) | ORAL | 3 refills | Status: AC

## 2024-10-11 NOTE — Telephone Encounter (Signed)
 Referral sent to [facility]

## 2024-12-25 ENCOUNTER — Ambulatory Visit: Admitting: Family Medicine

## 2025-08-22 ENCOUNTER — Telehealth: Admitting: Neurology
